# Patient Record
Sex: Female | Born: 1975 | Race: Black or African American | Hispanic: No | Marital: Single | Smoking: Never smoker
Health system: Southern US, Community
[De-identification: ages and names within clinical notes are randomized; demographics above are authoritative.]

---

## 1998-12-29 ENCOUNTER — Emergency Department (HOSPITAL_COMMUNITY): Admission: EM | Admit: 1998-12-29 | Discharge: 1998-12-29 | Payer: Self-pay | Admitting: Emergency Medicine

## 2000-09-28 ENCOUNTER — Encounter: Admission: RE | Admit: 2000-09-28 | Discharge: 2000-09-28 | Payer: Self-pay | Admitting: Obstetrics and Gynecology

## 2000-09-28 ENCOUNTER — Encounter: Payer: Self-pay | Admitting: Obstetrics and Gynecology

## 2001-11-11 ENCOUNTER — Other Ambulatory Visit: Admission: RE | Admit: 2001-11-11 | Discharge: 2001-11-11 | Payer: Self-pay | Admitting: Obstetrics and Gynecology

## 2001-11-19 ENCOUNTER — Encounter: Admission: RE | Admit: 2001-11-19 | Discharge: 2001-11-19 | Payer: Self-pay | Admitting: Obstetrics and Gynecology

## 2001-11-19 ENCOUNTER — Encounter: Payer: Self-pay | Admitting: Obstetrics and Gynecology

## 2003-01-15 ENCOUNTER — Other Ambulatory Visit: Admission: RE | Admit: 2003-01-15 | Discharge: 2003-01-15 | Payer: Self-pay | Admitting: Obstetrics and Gynecology

## 2004-01-19 ENCOUNTER — Other Ambulatory Visit: Admission: RE | Admit: 2004-01-19 | Discharge: 2004-01-19 | Payer: Self-pay | Admitting: Obstetrics and Gynecology

## 2005-05-03 ENCOUNTER — Other Ambulatory Visit: Admission: RE | Admit: 2005-05-03 | Discharge: 2005-05-03 | Payer: Self-pay | Admitting: Obstetrics and Gynecology

## 2015-11-11 ENCOUNTER — Emergency Department (HOSPITAL_COMMUNITY): Payer: Self-pay

## 2015-11-11 ENCOUNTER — Encounter (HOSPITAL_COMMUNITY): Payer: Self-pay | Admitting: *Deleted

## 2015-11-11 ENCOUNTER — Observation Stay (HOSPITAL_COMMUNITY): Payer: Self-pay

## 2015-11-11 ENCOUNTER — Inpatient Hospital Stay (HOSPITAL_COMMUNITY)
Admission: EM | Admit: 2015-11-11 | Discharge: 2015-11-13 | DRG: 446 | Disposition: A | Discharge status: Home / Self Care | Payer: Self-pay | Attending: Internal Medicine | Admitting: Internal Medicine

## 2015-11-11 DIAGNOSIS — R1011 Right upper quadrant pain: Secondary | ICD-10-CM

## 2015-11-11 DIAGNOSIS — K838 Other specified diseases of biliary tract: Secondary | ICD-10-CM | POA: Diagnosis present

## 2015-11-11 DIAGNOSIS — K8070 Calculus of gallbladder and bile duct without cholecystitis without obstruction: Principal | ICD-10-CM | POA: Diagnosis present

## 2015-11-11 DIAGNOSIS — K8050 Calculus of bile duct without cholangitis or cholecystitis without obstruction: Secondary | ICD-10-CM | POA: Diagnosis present

## 2015-11-11 LAB — URINALYSIS, ROUTINE W REFLEX MICROSCOPIC
Glucose, UA: NEGATIVE mg/dL
Ketones, ur: 15 mg/dL — AB
Leukocytes, UA: NEGATIVE
Nitrite: NEGATIVE
Protein, ur: NEGATIVE mg/dL
Specific Gravity, Urine: 1.007 (ref 1.005–1.030)
pH: 6.5 (ref 5.0–8.0)

## 2015-11-11 LAB — CBC WITH DIFFERENTIAL/PLATELET
Basophils Absolute: 0 10*3/uL (ref 0.0–0.1)
Basophils Relative: 1 %
Eosinophils Absolute: 0.1 10*3/uL (ref 0.0–0.7)
Eosinophils Relative: 2 %
HCT: 38.4 % (ref 36.0–46.0)
Hemoglobin: 12.9 g/dL (ref 12.0–15.0)
Lymphocytes Relative: 23 %
Lymphs Abs: 1 10*3/uL (ref 0.7–4.0)
MCH: 28.7 pg (ref 26.0–34.0)
MCHC: 33.6 g/dL (ref 30.0–36.0)
MCV: 85.3 fL (ref 78.0–100.0)
Monocytes Absolute: 0.5 10*3/uL (ref 0.1–1.0)
Monocytes Relative: 11 %
Neutro Abs: 2.8 10*3/uL (ref 1.7–7.7)
Neutrophils Relative %: 63 %
Platelets: 240 10*3/uL (ref 150–400)
RBC: 4.5 MIL/uL (ref 3.87–5.11)
RDW: 13 % (ref 11.5–15.5)
WBC: 4.4 10*3/uL (ref 4.0–10.5)

## 2015-11-11 LAB — URINE MICROSCOPIC-ADD ON

## 2015-11-11 LAB — COMPREHENSIVE METABOLIC PANEL
ALT: 249 U/L — ABNORMAL HIGH (ref 14–54)
AST: 50 U/L — ABNORMAL HIGH (ref 15–41)
Albumin: 4.2 g/dL (ref 3.5–5.0)
Alkaline Phosphatase: 117 U/L (ref 38–126)
Anion gap: 10 (ref 5–15)
BUN: 6 mg/dL (ref 6–20)
CO2: 23 mmol/L (ref 22–32)
Calcium: 9.2 mg/dL (ref 8.9–10.3)
Chloride: 106 mmol/L (ref 101–111)
Creatinine, Ser: 0.63 mg/dL (ref 0.44–1.00)
GFR calc Af Amer: >60 mL/min (ref >60)
GFR calc non Af Amer: >60 mL/min (ref >60)
Glucose, Bld: 119 mg/dL — ABNORMAL HIGH (ref 65–99)
Potassium: 3.8 mmol/L (ref 3.5–5.1)
Sodium: 139 mmol/L (ref 135–145)
Total Bilirubin: 5.1 mg/dL — ABNORMAL HIGH (ref 0.3–1.2)
Total Protein: 7.7 g/dL (ref 6.5–8.1)

## 2015-11-11 LAB — LIPASE, BLOOD: Lipase: 27 U/L (ref 11–51)

## 2015-11-11 LAB — I-STAT BETA HCG BLOOD, ED (MC, WL, AP ONLY)

## 2015-11-11 MED ORDER — HEPARIN SODIUM (PORCINE) 5000 UNIT/ML IJ SOLN
5000.0000 [IU] | Freq: Three times a day (TID) | INTRAMUSCULAR | Status: DC
  Administered 2015-11-11 – 2015-11-13 (×4): 5000 [IU] via SUBCUTANEOUS
  Filled 2015-11-11 (×9): qty 1

## 2015-11-11 MED ORDER — SODIUM CHLORIDE 0.9 % IV SOLN
INTRAVENOUS | Status: DC
  Administered 2015-11-11: 75 mL/h via INTRAVENOUS
  Administered 2015-11-12 (×2): via INTRAVENOUS

## 2015-11-11 MED ORDER — ONDANSETRON HCL 4 MG PO TABS: 4.0000 mg | ORAL_TABLET | Freq: Four times a day (QID) | ORAL | Status: DC | PRN

## 2015-11-11 MED ORDER — GADOBENATE DIMEGLUMINE 529 MG/ML IV SOLN
15.0000 mL | Freq: Once | INTRAVENOUS | Status: AC | PRN
  Administered 2015-11-11: 14 mL via INTRAVENOUS

## 2015-11-11 MED ORDER — MORPHINE SULFATE (PF) 4 MG/ML IV SOLN
4.0000 mg | INTRAVENOUS | Status: DC | PRN
  Administered 2015-11-13 (×3): 4 mg via INTRAVENOUS
  Filled 2015-11-11 (×3): qty 1

## 2015-11-11 MED ORDER — POLYETHYLENE GLYCOL 3350 17 G PO PACK: 17.0000 g | PACK | Freq: Every day | ORAL | Status: DC | PRN

## 2015-11-11 MED ORDER — ONDANSETRON HCL 4 MG/2ML IJ SOLN: 4.0000 mg | Freq: Four times a day (QID) | INTRAMUSCULAR | Status: DC | PRN

## 2015-11-11 NOTE — ED Notes (Signed)
Ultrasound at bedside

## 2015-11-11 NOTE — H&P (Signed)
[facility] History and Physical  Sonia Snyder ZOX:[id number] DOB: 1976-06-24 DOA: 11/11/2015  Referring physician: Dr. Clydene Pugh PCP: No primary care provider on file.   Chief Complaint: abd pain  HPI: Sonia Snyder is a 40 y.o. female with no significant past medical history comes into the hospital for abdominal pain that started 4 days prior to admission she relates it would be intermittent, and improved with drinking water. Nothing made it worse. The day prior to admission she started having severe abdominal pain with radiation to her back progressively getting worst anorexic. She took 2 ibuprofen and came to the ED. She denies any fever, chills, nausea, vomiting or diarrhea  In the ED: Is found to have mild elevation in AST and ALT.  Review of Systems:  Constitutional:  No weight loss, night sweats, Fevers, chills, fatigue.  HEENT:  No headaches, Difficulty swallowing,Tooth/dental problems,Sore throat,  No sneezing, itching, ear ache, nasal congestion, post nasal drip,  Cardio-vascular:  No chest pain, Orthopnea, PND, swelling in lower extremities, anasarca, dizziness, palpitations  GI:  No heartburn, indigestion,  nausea, vomiting, diarrhea, change in bowel habits, loss of appetite  Resp:  No shortness of breath with exertion or at rest. No excess mucus, no productive cough, No non-productive cough, No coughing up of blood.No change in color of mucus.No wheezing.No chest wall deformity  Skin:  no rash or lesions.  GU:  no dysuria, change in color of urine, no urgency or frequency. No flank pain.  Musculoskeletal:  No joint pain or swelling. No decreased range of motion. No back pain.  Psych:  No change in mood or affect. No depression or anxiety. No memory loss.   History reviewed. No pertinent past medical history. History reviewed. No pertinent past surgical history. Social History:  reports that she has never smoked. She does not have any smokeless tobacco history  on file. She reports that she does not drink alcohol. Her drug history is not on file.  No Known Allergies  Family History  Problem Relation Age of Onset  . Diabetes Mellitus II Mother   . Hypertension Mother     Prior to Admission medications   Medication Sig Start Date End Date Taking? Authorizing Provider  ibuprofen (ADVIL,MOTRIN) 200 MG tablet Take 400 mg by mouth every 6 (six) hours as needed for headache, mild pain or moderate pain.   Yes Historical Provider, MD   Physical Exam: Filed Vitals:   11/11/15 0530 11/11/15 8119 11/11/15 0756 11/11/15 0958  BP:  111/67 118/71 114/69  Pulse:  73 67 69  Temp:      TempSrc:      Resp:  Height:  (1.702 m)     Weight: 70.308 kg (155 lb)     SpO2:  100% 98% 100%    Wt Readings from Last 3 Encounters:  11/11/15 70.308 kg (155 lb)    General:  Appears calm and comfortable Eyes: PERRL, normal lids, irises & conjunctiva ENT: grossly normal hearing, lips & tongue Neck: no LAD, masses or thyromegaly Cardiovascular: RRR, no m/r/g. No LE edema. Telemetry: SR, no arrhythmias  Respiratory: CTA bilaterally, no w/r/r. Normal respiratory effort. Abdomen: soft, right upper quadrant tenderness Murphy sign negative nondistended with bowel sounds Skin: no rash or induration seen on limited exam Musculoskeletal: grossly normal tone BUE/BLE Psychiatric: grossly normal mood and affect, speech fluent and appropriate Neurologic: grossly non-focal.          Labs on Admission:  Basic Metabolic Panel:  Recent Labs Lab 11/11/15 0642  NA 139  K 3.8  CL 106  CO2 23  GLUCOSE 119*  BUN 6  CREATININE 0.63  CALCIUM 9.2   Liver Function Tests:  Recent Labs Lab 11/11/15 0642  AST 50*  ALT 249*  ALKPHOS 117  BILITOT 5.1*  PROT 7.7  ALBUMIN 4.2    Recent Labs Lab 11/11/15 0642  LIPASE 27   No results for input(s): AMMONIA in the last 168 hours. CBC:  Recent Labs Lab 11/11/15 0642  WBC 4.4  NEUTROABS 2.8  HGB  12.9  HCT 38.4  MCV 85.3  PLT 240   Cardiac Enzymes: No results for input(s): CKTOTAL, CKMB, CKMBINDEX, TROPONINI in the last 168 hours.  BNP (last 3 results) No results for input(s): BNP in the last 8760 hours.  ProBNP (last 3 results) No results for input(s): PROBNP in the last 8760 hours.  CBG: No results for input(s): GLUCAP in the last 168 hours.  Radiological Exams on Admission: [place] Abdomen Limited Ruq  11/11/2015  CLINICAL DATA:  Right upper quadrant pain back pain duration 3 days EXAM: US ABDOMEN LIMITED - RIGHT UPPER QUADRANT COMPARISON:  None. FINDINGS: Gallbladder: There are multiple calculi within the gallbladder. The largest calculus measures 9 mm. Borderline gallbladder wall thickening. No pericholecystic fluid. Common bile duct: Diameter: 8 mm. Liver: There is mild intrahepatic bile duct dilatation. No focal liver lesions. IMPRESSION: Cholelithiasis. Mild bile duct dilatation, both intrahepatic and extrahepatic. This is concerning for possible choledocholithiasis. Electronically Signed   By: Ellery Plunkaniel R Mitchell M.D.   On: 11/11/2015 06:45    EKG: Independently reviewed.  Assessment/Plan Active Problems:   Choledocholithiasis Abdominal ultrasound done in the emergency room showed dilated intrahepatic and extrahepatic ducts, her alkaline phosphatase is 117 she still has mild elevation of her ALT and AST higher than the latter, with mild elevation of her bilirubin She could've passed a stone, but either way would get an MRCP and will have GI evaluate her.  We'll place her nothing by mouth until she is evaluated by GI and MRCP done. Repeat LFTs in the morning. GI has been consulted by the emergency room physician  Code Status: full DVT Prophylaxis:heparin Family Communication: none  Disposition Plan: observation  Time spent:60 min  Marinda ElkFELIZ ORTIZ, ABRAHAM [facility] Pager [phone]

## 2015-11-11 NOTE — Consult Note (Signed)
[facility] Surgery Consult Note  Sonia Snyder [birth year]  [id number].    Requesting MD: Dr. Laneta Simmers Chief Complaint/Reason for Consult: Cholelithiasis  HPI:  40 y/o AA female with no significant PMH and no surgical history except for WTE comes to [facility] for abdominal pain that started 4 days prior to admission (Monday 11/08/15).  Pain is intermittent, and improved with drinking water with apple cider vinegar in it. No precipitating factors.  She says on Monday she had a big breakfast then 5-7 hours later the pain began.  She has had a normal appetite and normal bowel functions.  Yesterday she started having severe abdominal pain with radiation to her back and progressive anorexia.  She took 2 ibuprofen and came to the ED. She thought she was having a kidney stone.  She denies any fever, chills, nausea, vomiting, diarrhea, CP/SOB, or dysuria.  No abdominal surgery or h/o abdominal problems.    ROS: All systems reviewed and otherwise negative except for as above  Family History  Problem Relation Age of Onset  . Diabetes Mellitus II Mother   . Hypertension Mother     History reviewed. No pertinent past medical history.  History reviewed. No pertinent past surgical history.  Social History:  reports that she has never smoked. She does not have any smokeless tobacco history on file. She reports that she does not drink alcohol. Her drug history is not on file.  Allergies: No Known Allergies  Medications Prior to Admission  Medication Sig Dispense Refill  . ibuprofen (ADVIL,MOTRIN) 200 MG tablet Take 400 mg by mouth every 6 (six) hours as needed for headache, mild pain or moderate pain.      Blood pressure 123/59, pulse 73, temperature 97.8 F (36.6 C), temperature source Oral, resp. rate 20, height 5' 7"  (1.702 m), weight 155 lb (70.308 kg), last menstrual period 11/11/2015, SpO2 100 %. Physical Exam: General: pleasant, WD/WN AA female who is laying in bed in NAD HEENT: head is  normocephalic, atraumatic.  Sclera are noninjected.  PERRL.  Ears and nose without any masses or lesions.  Mouth is pink and moist Heart: regular, rate, and rhythm.  No obvious murmurs, gallops, or rubs noted.  Palpable pedal pulses bilaterally Lungs: CTAB, no wheezes, rhonchi, or rales noted.  Respiratory effort nonlabored Abd: soft, mild tenderness in the RUQ, ND, +BS, no masses, hernias, or organomegaly, no surgical scars MS: all 4 extremities are symmetrical with no cyanosis, clubbing, or edema. Skin: warm and dry with no masses, lesions, or rashes Psych: A&Ox3 with an appropriate affect.   Results for orders placed or performed during the hospital encounter of 11/11/15 (from the past 48 hour(s))  Urinalysis, Routine w reflex microscopic-may I&O cath if menses (not at [facility])     Status: Abnormal   Collection Time: 11/11/15  5:30 AM  Result Value Ref Range   Color, Urine AMBER (A) YELLOW    Comment: BIOCHEMICALS MAY BE AFFECTED BY COLOR   APPearance CLOUDY (A) CLEAR   Specific Gravity, Urine 1.007 1.005 - 1.030   pH 6.5 5.0 - 8.0   Glucose, UA NEGATIVE NEGATIVE mg/dL   Hgb urine dipstick LARGE (A) NEGATIVE   Bilirubin Urine MODERATE (A) NEGATIVE   Ketones, ur 15 (A) NEGATIVE mg/dL   Protein, ur NEGATIVE NEGATIVE mg/dL   Nitrite NEGATIVE NEGATIVE   Leukocytes, UA NEGATIVE NEGATIVE  Urine microscopic-add on     Status: Abnormal   Collection Time: 11/11/15  5:30 AM  Result Value Ref Range  Squamous Epithelial / LPF 0-5 (A) NONE SEEN   WBC, UA 0-5 0 - 5 WBC/hpf   RBC / HPF TOO NUMEROUS TO COUNT 0 - 5 RBC/hpf   Bacteria, UA RARE (A) NONE SEEN  CBC with Differential/Platelet     Status: None   Collection Time: 11/11/15  6:42 AM  Result Value Ref Range   WBC 4.4 4.0 - 10.5 K/uL   RBC 4.50 3.87 - 5.11 MIL/uL   Hemoglobin 12.9 12.0 - 15.0 g/dL   HCT 38.4 36.0 - 46.0 %   MCV 85.3 78.0 - 100.0 fL   MCH 28.7 26.0 - 34.0 pg   MCHC 33.6 30.0 - 36.0 g/dL   RDW 13.0 11.5 - 15.5 %    Platelets 240 150 - 400 K/uL   Neutrophils Relative % 63 %   Neutro Abs 2.8 1.7 - 7.7 K/uL   Lymphocytes Relative 23 %   Lymphs Abs 1.0 0.7 - 4.0 K/uL   Monocytes Relative 11 %   Monocytes Absolute 0.5 0.1 - 1.0 K/uL   Eosinophils Relative 2 %   Eosinophils Absolute 0.1 0.0 - 0.7 K/uL   Basophils Relative 1 %   Basophils Absolute 0.0 0.0 - 0.1 K/uL  Comprehensive metabolic panel     Status: Abnormal   Collection Time: 11/11/15  6:42 AM  Result Value Ref Range   Sodium 139 135 - 145 mmol/L   Potassium 3.8 3.5 - 5.1 mmol/L   Chloride 106 101 - 111 mmol/L   CO2 23 22 - 32 mmol/L   Glucose, Bld 119 (H) 65 - 99 mg/dL   BUN 6 6 - 20 mg/dL   Creatinine, Ser 0.63 0.44 - 1.00 mg/dL   Calcium 9.2 8.9 - 10.3 mg/dL   Total Protein 7.7 6.5 - 8.1 g/dL   Albumin 4.2 3.5 - 5.0 g/dL   AST 50 (H) 15 - 41 U/L   ALT 249 (H) 14 - 54 U/L   Alkaline Phosphatase 117 38 - 126 U/L   Total Bilirubin 5.1 (H) 0.3 - 1.2 mg/dL   GFR calc non Af Amer >60 >60 mL/min   GFR calc Af Amer >60 >60 mL/min    Comment: (NOTE) The eGFR has been calculated using the CKD EPI equation. This calculation has not been validated in all clinical situations. eGFR's persistently <60 mL/min signify possible Chronic Kidney Disease.    Anion gap 10 5 - 15  Lipase, blood     Status: None   Collection Time: 11/11/15  6:42 AM  Result Value Ref Range   Lipase 27 11 - 51 U/L  I-Stat beta hCG blood, ED ([facility], [facility], [facility] only)     Status: None   Collection Time: 11/11/15  6:48 AM  Result Value Ref Range   I-stat hCG, quantitative <5.0 <5 mIU/mL   Comment 3            Comment:   GEST. AGE      CONC.  (mIU/mL)   <=1 WEEK        5 - 50     2 WEEKS       50 - 500     3 WEEKS       100 - 10,000     4 WEEKS     1,000 - 30,000        FEMALE AND NON-PREGNANT FEMALE:     LESS THAN 5 mIU/mL    US Abdomen Limited Ruq  11/11/2015  CLINICAL DATA:  Right upper quadrant pain back pain duration 3 days EXAM: US ABDOMEN LIMITED - RIGHT UPPER  QUADRANT COMPARISON:  None. FINDINGS: Gallbladder: There are multiple calculi within the gallbladder. The largest calculus measures 9 mm. Borderline gallbladder wall thickening. No pericholecystic fluid. Common bile duct: Diameter: 8 mm. Liver: There is mild intrahepatic bile duct dilatation. No focal liver lesions. IMPRESSION: Cholelithiasis. Mild bile duct dilatation, both intrahepatic and extrahepatic. This is concerning for possible choledocholithiasis. Electronically Signed   By: Andreas Newport M.D.   On: 11/11/2015 06:45      Assessment/Plan Cholelithiasis with possible choledocholithiasis RUQ abdominal pain Hyperbilirubinemia Transaminitis  Plan: 1.  MRCP pending, if positive for stones, GI will proceed with ERCP.  If negative we would recommend lap chole, but the patient is adamant about NO SURGERY.  I have discussed her pathophysiology and the surgery in detail and reviewed anatomy pictures with her, but she is scared and does not want surgery.  I have discussed the consequences of leaving the gallbladder intact and the likelihood of this happening again.  She understands and does not want surgery. 2.  If we can be of further help, please feel free to call us back 3.  Would recommend low fat diet indefinately   Nat Christen, [facility] Surgery 11/11/2015, 1:48 PM Pager: [phone] (7am - 4:30pm M-F; 7am - 11:30am Sa/Su)  Agree with above. MRCP pending - though I think that she will have a CBD stone.  I discussed the role and purpose of gall bladder surgery. I discussed the risks of gall bladder surgery.  The primary risks of gall bladder surgery include, but are not limited to, bleeding, infection, common bile duct injury, and open surgery.  There is also the risk that the patient may have continued symptoms after surgery.  We discussed the typical post-operative recovery course. I tried to answer the patient's questions. If she elects not to have surgery - she is  aware she can get further common bile duct stones.  She could develop significant gall bladder infection, which may preclude laparoscopic surgery. But the final decision is hers.  At this time, she is undecided about surgery  Alphonsa Overall, MD, [facility] Surgery Pager: [phone] Office phone:  [phone]

## 2015-11-11 NOTE — Consult Note (Addendum)
Reason for Consult: Questionable CBD stones Referring Physician: Hospital team  Sonia Snyder is an 40 y.o. female.  HPI: Patient seen and examined and her hospital computer chart was reviewed and I discussed her case with the ER physician and she is currently feeling much better and has an essentially negative GI history and only one cousin with gallbladder problems and no previous surgeries who began having abdominal pain and nausea vomiting and presented to the emergency room but her pain has since resolved and her ultrasound and labs were reviewed and she did just start a new job and we discussed how she cannot miss much work as well as the risks of gallbladder problems  History reviewed. No pertinent past medical history.  History reviewed. No pertinent past surgical history.  Family History  Problem Relation Age of Onset  . Diabetes Mellitus II Mother   . Hypertension Mother     Social History:  reports that she has never smoked. She does not have any smokeless tobacco history on file. She reports that she does not drink alcohol. Her drug history is not on file.  Allergies: No Known Allergies  Medications: I have reviewed the patient's current medications.  Results for orders placed or performed during the hospital encounter of 11/11/15 (from the past 48 hour(s))  Urinalysis, Routine w reflex microscopic-may I&O cath if menses (not at [facility])     Status: Abnormal   Collection Time: 11/11/15  5:30 AM  Result Value Ref Range   Color, Urine AMBER (A) YELLOW    Comment: BIOCHEMICALS MAY BE AFFECTED BY COLOR   APPearance CLOUDY (A) CLEAR   Specific Gravity, Urine 1.007 1.005 - 1.030   pH 6.5 5.0 - 8.0   Glucose, UA NEGATIVE NEGATIVE mg/dL   Hgb urine dipstick LARGE (A) NEGATIVE   Bilirubin Urine MODERATE (A) NEGATIVE   Ketones, ur 15 (A) NEGATIVE mg/dL   Protein, ur NEGATIVE NEGATIVE mg/dL   Nitrite NEGATIVE NEGATIVE   Leukocytes, UA NEGATIVE NEGATIVE  Urine microscopic-add on      Status: Abnormal   Collection Time: 11/11/15  5:30 AM  Result Value Ref Range   Squamous Epithelial / LPF 0-5 (A) NONE SEEN   WBC, UA 0-5 0 - 5 WBC/hpf   RBC / HPF TOO NUMEROUS TO COUNT 0 - 5 RBC/hpf   Bacteria, UA RARE (A) NONE SEEN  CBC with Differential/Platelet     Status: None   Collection Time: 11/11/15  6:42 AM  Result Value Ref Range   WBC 4.4 4.0 - 10.5 K/uL   RBC 4.50 3.87 - 5.11 MIL/uL   Hemoglobin 12.9 12.0 - 15.0 g/dL   HCT 38.4 36.0 - 46.0 %   MCV 85.3 78.0 - 100.0 fL   MCH 28.7 26.0 - 34.0 pg   MCHC 33.6 30.0 - 36.0 g/dL   RDW 13.0 11.5 - 15.5 %   Platelets 240 150 - 400 K/uL   Neutrophils Relative % 63 %   Neutro Abs 2.8 1.7 - 7.7 K/uL   Lymphocytes Relative 23 %   Lymphs Abs 1.0 0.7 - 4.0 K/uL   Monocytes Relative 11 %   Monocytes Absolute 0.5 0.1 - 1.0 K/uL   Eosinophils Relative 2 %   Eosinophils Absolute 0.1 0.0 - 0.7 K/uL   Basophils Relative 1 %   Basophils Absolute 0.0 0.0 - 0.1 K/uL  Comprehensive metabolic panel     Status: Abnormal   Collection Time: 11/11/15  6:42 AM  Result Value Ref Range  Sodium 139 135 - 145 mmol/L   Potassium 3.8 3.5 - 5.1 mmol/L   Chloride 106 101 - 111 mmol/L   CO2 23 22 - 32 mmol/L   Glucose, Bld 119 (H) 65 - 99 mg/dL   BUN 6 6 - 20 mg/dL   Creatinine, Ser 0.63 0.44 - 1.00 mg/dL   Calcium 9.2 8.9 - 10.3 mg/dL   Total Protein 7.7 6.5 - 8.1 g/dL   Albumin 4.2 3.5 - 5.0 g/dL   AST 50 (H) 15 - 41 U/L   ALT 249 (H) 14 - 54 U/L   Alkaline Phosphatase 117 38 - 126 U/L   Total Bilirubin 5.1 (H) 0.3 - 1.2 mg/dL   GFR calc non Af Amer >60 >60 mL/min   GFR calc Af Amer >60 >60 mL/min    Comment: (NOTE) The eGFR has been calculated using the CKD EPI equation. This calculation has not been validated in all clinical situations. eGFR's persistently <60 mL/min signify possible Chronic Kidney Disease.    Anion gap 10 5 - 15  Lipase, blood     Status: None   Collection Time: 11/11/15  6:42 AM  Result Value Ref Range    Lipase 27 11 - 51 U/L  I-Stat beta hCG blood, ED ([facility], [facility], [facility] only)     Status: None   Collection Time: 11/11/15  6:48 AM  Result Value Ref Range   I-stat hCG, quantitative <5.0 <5 mIU/mL   Comment 3            Comment:   GEST. AGE      CONC.  (mIU/mL)   <=1 WEEK        5 - 50     2 WEEKS       50 - 500     3 WEEKS       100 - 10,000     4 WEEKS     1,000 - 30,000        FEMALE AND NON-PREGNANT FEMALE:     LESS THAN 5 mIU/mL     US Abdomen Limited Ruq  11/11/2015  CLINICAL DATA:  Right upper quadrant pain back pain duration 3 days EXAM: US ABDOMEN LIMITED - RIGHT UPPER QUADRANT COMPARISON:  None. FINDINGS: Gallbladder: There are multiple calculi within the gallbladder. The largest calculus measures 9 mm. Borderline gallbladder wall thickening. No pericholecystic fluid. Common bile duct: Diameter: 8 mm. Liver: There is mild intrahepatic bile duct dilatation. No focal liver lesions. IMPRESSION: Cholelithiasis. Mild bile duct dilatation, both intrahepatic and extrahepatic. This is concerning for possible choledocholithiasis. Electronically Signed   By: Andreas Newport M.D.   On: 11/11/2015 06:45    ROS negative except above Blood pressure 123/59, pulse 73, temperature 97.8 F (36.6 C), temperature source Oral, resp. rate 20, height _0  (1.702 m), weight 70.308 kg (155 lb), last menstrual period 11/11/2015, SpO2 100 %. Physical Exam Vital signs stable afebrile no acute distress lungs clear regular rate and rhythm abdomen is soft nontender labs and ultrasound reviewed Assessment/Plan: Question early passed CBD stone Plan: Await MRCP if positive will proceed with ERCP tomorrow and the risks benefits methods options and success rate was discussed thoroughly with the patient however if MRCP is negative you could advance diet and discharged soon with outpatient surgical follow-up to consider laparoscopic cholecystectomy with Intra-Op cholangiogram and the warnings of putting that off was  discussed with the patient as well And please call me ASAP with results of MRCP so as  to set up ERCP if needed  [facility] E 11/11/2015, 2:38 PM

## 2015-11-11 NOTE — ED Notes (Signed)
US at bedside

## 2015-11-11 NOTE — ED Notes (Signed)
Hospitalist at bedside 

## 2015-11-11 NOTE — ED Notes (Signed)
Patient is alert and oriented x4.  She is complaining of right flank pain that starred on Monday. Patient denies ever having this issue before.  Currently she rates her pain 8 of 10 without nausea.

## 2015-11-11 NOTE — ED Provider Notes (Signed)
Care assumed from Dr. Blinda LeatherwoodPollina at 0800 with plan for GI consultation for suspected choledocholithiasis.   Results:  BP 118/71 mmHg  Pulse 67  Temp(Src) 98 F (36.7 C) (Oral)  Resp 18  Ht 5\' 7"  (1.702 m)  Wt 155 lb (70.308 kg)  BMI 24.27 kg/m2  SpO2 98%  LMP 11/11/2015 (Exact Date)  Results for orders placed or performed during the hospital encounter of 11/11/15  Urinalysis, Routine w reflex microscopic-may I&O cath if menses ([facility])  Result Value Ref Range   Color, Urine AMBER (A) YELLOW   APPearance CLOUDY (A) CLEAR   Specific Gravity, Urine 1.007 1.005 - 1.030   pH 6.5 5.0 - 8.0   Glucose, UA NEGATIVE NEGATIVE mg/dL   Hgb urine dipstick LARGE (A) NEGATIVE   Bilirubin Urine MODERATE (A) NEGATIVE   Ketones, ur 15 (A) NEGATIVE mg/dL   Protein, ur NEGATIVE NEGATIVE mg/dL   Nitrite NEGATIVE NEGATIVE   Leukocytes, UA NEGATIVE NEGATIVE  CBC with Differential/Platelet  Result Value Ref Range   WBC 4.4 4.0 - 10.5 K/uL   RBC 4.50 3.87 - 5.11 MIL/uL   Hemoglobin 12.9 12.0 - 15.0 g/dL   HCT 82.938.4 56.236.0 - 13.046.0 %   MCV 85.3 78.0 - 100.0 fL   MCH 28.7 26.0 - 34.0 pg   MCHC 33.6 30.0 - 36.0 g/dL   RDW 86.513.0 78.411.5 - 69.615.5 %   Platelets 240 150 - 400 K/uL   Neutrophils Relative % 63 %   Neutro Abs 2.8 1.7 - 7.7 K/uL   Lymphocytes Relative 23 %   Lymphs Abs 1.0 0.7 - 4.0 K/uL   Monocytes Relative 11 %   Monocytes Absolute 0.5 0.1 - 1.0 K/uL   Eosinophils Relative 2 %   Eosinophils Absolute 0.1 0.0 - 0.7 K/uL   Basophils Relative 1 %   Basophils Absolute 0.0 0.0 - 0.1 K/uL  Comprehensive metabolic panel  Result Value Ref Range   Sodium 139 135 - 145 mmol/L   Potassium 3.8 3.5 - 5.1 mmol/L   Chloride 106 101 - 111 mmol/L   CO2 23 22 - 32 mmol/L   Glucose, Bld 119 (H) 65 - 99 mg/dL   BUN 6 6 - 20 mg/dL   Creatinine, Ser 2.950.63 0.44 - 1.00 mg/dL   Calcium 9.2 8.9 - 28.410.3 mg/dL   Total Protein 7.7 6.5 - 8.1 g/dL   Albumin 4.2 3.5 - 5.0 g/dL   AST 50 (H) 15 - 41 U/L   ALT 249 (H)  14 - 54 U/L   Alkaline Phosphatase 117 38 - 126 U/L   Total Bilirubin 5.1 (H) 0.3 - 1.2 mg/dL   GFR calc non Af Amer >60 >60 mL/min   GFR calc Af Amer >60 >60 mL/min   Anion gap 10 5 - 15  Lipase, blood  Result Value Ref Range   Lipase 27 11 - 51 U/L  Urine microscopic-add on  Result Value Ref Range   Squamous Epithelial / LPF 0-5 (A) NONE SEEN   WBC, UA 0-5 0 - 5 WBC/hpf   RBC / HPF TOO NUMEROUS TO COUNT 0 - 5 RBC/hpf   Bacteria, UA RARE (A) NONE SEEN  I-Stat beta hCG blood, ED ([facility], [facility], [facility] only)  Result Value Ref Range   I-stat hCG, quantitative <5.0 <5 mIU/mL   Comment 3            [place] Abdomen Limited Ruq  11/11/2015  CLINICAL DATA:  Right upper quadrant pain back  pain duration 3 days EXAM: US ABDOMEN LIMITED - RIGHT UPPER QUADRANT COMPARISON:  None. FINDINGS: Gallbladder: There are multiple calculi within the gallbladder. The largest calculus measures 9 mm. Borderline gallbladder wall thickening. No pericholecystic fluid. Common bile duct: Diameter: 8 mm. Liver: There is mild intrahepatic bile duct dilatation. No focal liver lesions. IMPRESSION: Cholelithiasis. Mild bile duct dilatation, both intrahepatic and extrahepatic. This is concerning for possible choledocholithiasis. Electronically Signed   By: Ellery Plunk M.D.   On: 11/11/2015 06:45    Radiology and laboratory examinations were reviewed by me and used in medical decision making if performed.   MDM:  GI recommending MRCP and surgical consultation if indicated by results vs endoscopic manipulation. Hospitalist was consulted for admission and will see the patient in the emergency department. I was asked to discuss case with surgery team, they recommend medical admission, complete MRCP and they would consider cholecystectomy after resolution.   Diagnoses that have been ruled out:  None  Diagnoses that are still under consideration:  None  Final diagnoses:  Right upper quadrant pain  Choledocholithiasis     Lyndal Pulley, MD 11/11/15 1001

## 2015-11-12 ENCOUNTER — Encounter (HOSPITAL_COMMUNITY): Payer: Self-pay | Admitting: Certified Registered"

## 2015-11-12 ENCOUNTER — Encounter (HOSPITAL_COMMUNITY)
Admission: EM | Disposition: A | Discharge status: Home / Self Care | Payer: Self-pay | Source: Home / Self Care | Attending: Internal Medicine

## 2015-11-12 ENCOUNTER — Observation Stay (HOSPITAL_COMMUNITY): Payer: MEDICAID | Admitting: Anesthesiology

## 2015-11-12 ENCOUNTER — Observation Stay (HOSPITAL_COMMUNITY): Payer: MEDICAID

## 2015-11-12 DIAGNOSIS — K8050 Calculus of bile duct without cholangitis or cholecystitis without obstruction: Secondary | ICD-10-CM

## 2015-11-12 HISTORY — PX: ERCP: SHX5425

## 2015-11-12 LAB — COMPREHENSIVE METABOLIC PANEL
ALBUMIN: 3.6 g/dL (ref 3.5–5.0)
ALT: 183 U/L — ABNORMAL HIGH (ref 14–54)
ANION GAP: 13 (ref 5–15)
AST: 42 U/L — AB (ref 15–41)
Alkaline Phosphatase: 109 U/L (ref 38–126)
BILIRUBIN TOTAL: 5 mg/dL — AB (ref 0.3–1.2)
BUN: 8 mg/dL (ref 6–20)
CHLORIDE: 107 mmol/L (ref 101–111)
CO2: 21 mmol/L — ABNORMAL LOW (ref 22–32)
Calcium: 8.9 mg/dL (ref 8.9–10.3)
Creatinine, Ser: 0.63 mg/dL (ref 0.44–1.00)
GFR calc Af Amer: >60 mL/min (ref >60)
GFR calc non Af Amer: >60 mL/min (ref >60)
GLUCOSE: 83 mg/dL (ref 65–99)
POTASSIUM: 3.8 mmol/L (ref 3.5–5.1)
SODIUM: 141 mmol/L (ref 135–145)
TOTAL PROTEIN: 7.2 g/dL (ref 6.5–8.1)

## 2015-11-12 LAB — CBC
HEMATOCRIT: 34.4 % — AB (ref 36.0–46.0)
Hemoglobin: 11.4 g/dL — ABNORMAL LOW (ref 12.0–15.0)
MCH: 28.5 pg (ref 26.0–34.0)
MCHC: 33.1 g/dL (ref 30.0–36.0)
MCV: 86 fL (ref 78.0–100.0)
PLATELETS: 244 10*3/uL (ref 150–400)
RBC: 4 MIL/uL (ref 3.87–5.11)
RDW: 13.1 % (ref 11.5–15.5)
WBC: 5.5 10*3/uL (ref 4.0–10.5)

## 2015-11-12 SURGERY — ERCP, WITH INTERVENTION IF INDICATED
Anesthesia: General

## 2015-11-12 MED ORDER — LIDOCAINE HCL (CARDIAC) 20 MG/ML IV SOLN
INTRAVENOUS | Status: AC
  Filled 2015-11-12: qty 5

## 2015-11-12 MED ORDER — LACTATED RINGERS IV SOLN
INTRAVENOUS | Status: DC
  Administered 2015-11-12: 13:00:00 via INTRAVENOUS
  Administered 2015-11-12: 1000 mL via INTRAVENOUS

## 2015-11-12 MED ORDER — MEPERIDINE HCL 100 MG/ML IJ SOLN: 6.2500 mg | INTRAMUSCULAR | Status: DC | PRN

## 2015-11-12 MED ORDER — ONDANSETRON HCL 4 MG/2ML IJ SOLN: 4.0000 mg | Freq: Once | INTRAMUSCULAR | Status: DC

## 2015-11-12 MED ORDER — PROPOFOL 10 MG/ML IV BOLUS
INTRAVENOUS | Status: DC | PRN
  Administered 2015-11-12: 50 mg via INTRAVENOUS
  Administered 2015-11-12: 150 mg via INTRAVENOUS
  Administered 2015-11-12: 20 mg via INTRAVENOUS

## 2015-11-12 MED ORDER — MIDAZOLAM HCL 5 MG/5ML IJ SOLN
INTRAMUSCULAR | Status: DC | PRN
  Administered 2015-11-12: 2 mg via INTRAVENOUS

## 2015-11-12 MED ORDER — DEXAMETHASONE SODIUM PHOSPHATE 10 MG/ML IJ SOLN
INTRAMUSCULAR | Status: DC | PRN
  Administered 2015-11-12: 10 mg via INTRAVENOUS

## 2015-11-12 MED ORDER — GLUCAGON HCL RDNA (DIAGNOSTIC) 1 MG IJ SOLR
INTRAMUSCULAR | Status: AC
  Filled 2015-11-12: qty 1

## 2015-11-12 MED ORDER — LIDOCAINE HCL (CARDIAC) 20 MG/ML IV SOLN
INTRAVENOUS | Status: DC | PRN
  Administered 2015-11-12: 100 mg via INTRAVENOUS

## 2015-11-12 MED ORDER — PROPOFOL 10 MG/ML IV BOLUS
INTRAVENOUS | Status: AC
  Filled 2015-11-12: qty 20

## 2015-11-12 MED ORDER — SUCCINYLCHOLINE CHLORIDE 20 MG/ML IJ SOLN
INTRAMUSCULAR | Status: DC | PRN
  Administered 2015-11-12: 100 mg via INTRAVENOUS

## 2015-11-12 MED ORDER — SODIUM CHLORIDE 0.9 % IV SOLN: INTRAVENOUS | Status: DC

## 2015-11-12 MED ORDER — MIDAZOLAM HCL 2 MG/2ML IJ SOLN
INTRAMUSCULAR | Status: AC
  Filled 2015-11-12: qty 2

## 2015-11-12 MED ORDER — ONDANSETRON HCL 4 MG/2ML IJ SOLN
INTRAMUSCULAR | Status: DC | PRN
  Administered 2015-11-12: 4 mg via INTRAVENOUS

## 2015-11-12 MED ORDER — CIPROFLOXACIN IN D5W 400 MG/200ML IV SOLN
INTRAVENOUS | Status: AC
  Filled 2015-11-12: qty 200

## 2015-11-12 MED ORDER — FENTANYL CITRATE (PF) 100 MCG/2ML IJ SOLN: 25.0000 ug | INTRAMUSCULAR | Status: DC | PRN

## 2015-11-12 MED ORDER — FENTANYL CITRATE (PF) 100 MCG/2ML IJ SOLN
INTRAMUSCULAR | Status: AC
  Filled 2015-11-12: qty 2

## 2015-11-12 MED ORDER — INDOMETHACIN 50 MG RE SUPP
100.0000 mg | Freq: Once | RECTAL | Status: AC
  Administered 2015-11-12: 100 mg via RECTAL

## 2015-11-12 MED ORDER — INDOMETHACIN 50 MG RE SUPP
RECTAL | Status: AC
  Filled 2015-11-12: qty 2

## 2015-11-12 MED ORDER — SODIUM CHLORIDE 0.9 % IV SOLN
INTRAVENOUS | Status: DC | PRN
  Administered 2015-11-12: 50 mL

## 2015-11-12 MED ORDER — PHENYLEPHRINE HCL 10 MG/ML IJ SOLN
INTRAMUSCULAR | Status: DC | PRN
  Administered 2015-11-12: 40 ug via INTRAVENOUS
  Administered 2015-11-12: 80 ug via INTRAVENOUS

## 2015-11-12 MED ORDER — FENTANYL CITRATE (PF) 100 MCG/2ML IJ SOLN
INTRAMUSCULAR | Status: DC | PRN
  Administered 2015-11-12 (×2): 50 ug via INTRAVENOUS

## 2015-11-12 MED ORDER — CIPROFLOXACIN IN D5W 400 MG/200ML IV SOLN
400.0000 mg | Freq: Once | INTRAVENOUS | Status: AC
  Administered 2015-11-12: 400 mg via INTRAVENOUS
  Filled 2015-11-12: qty 200

## 2015-11-12 NOTE — Op Note (Signed)
[facility] Patient Name: Sonia Snyder Procedure Date: 11/12/2015 MRN: [id number] Attending MD: Vida Rigger , MD Date of Birth: [birth year] CSN:  Age: 40 Admit Type: Inpatient Procedure:                ERCP Indications:              Suspected bile duct stone(s) Providers:                Vida Rigger, MD, Dwain Sarna, RN, Kandice Robinsons,                            Technician Referring MD:              Medicines:                General Anesthesia Complications:            No immediate complications. Estimated Blood Loss:     Estimated blood loss: none. Procedure:                Pre-Anesthesia Assessment:                           - Prior to the procedure, a History and Physical                            was performed, and patient medications and                            allergies were reviewed. The patient's tolerance of                            previous anesthesia was also reviewed. The risks                            and benefits of the procedure and the sedation                            options and risks were discussed with the patient.                            All questions were answered, and informed consent                            was obtained. Prior Anticoagulants: The patient has                            taken no previous anticoagulant or antiplatelet                            agents. ASA Grade Assessment: I - A normal, healthy                            patient. After reviewing the risks and benefits,                            the patient  was deemed in satisfactory condition to                            undergo the procedure.                           After obtaining informed consent, the scope was                            passed under direct vision. Throughout the                            procedure, the patient's blood pressure, pulse, and                            oxygen saturations were monitored continuously. The           [id number] ([id number]) scope was introduced through                            the mouth, and used to inject contrast into and                            used to locate the major papilla. The ERCP was                            somewhat difficult due to challenging cannulation.                            The patient tolerated the procedure well. Findings:      The major papilla was bulging. we initially tried to cannulate using the       regular sphincterotome loaded with the 0.035 Jagwirebut it seemed to go       towards the pancreas on multiple timesand no dye was injected but we       went ahead and placed a 5 Fr by 3 cm temporary plastic stent with a       single internal flap and a single external pigtail was placed into the       ventral pancreatic duct. The stent was in good position. we then tried       to cannulate the CBD with this regular sphincterotomebut were       unsuccessful so we switched to this smaller sphincterotome and the       smaller JAG Jagwire and were able toobtain cannulation in the bile duct       was deeply cannulated with the Hydratome sphincterotome. Contrast was       injected. I personally interpreted the bile duct images. Image quality       was adequate. Contrast extended to the main bile duct. Contrast extended       to the bifurcation. Choledocholithiasis was not found in a nondilated       duct. Biliary sphincterotomy was made with a Hydratome sphincterotome       using ERBE electrocautery. we extended the sphincterotomy as far as we       safely could to the lateral fold but could only insert the half to  three-quarter bowed sphincterotome completelyand could not insert the       fully bowed sphincterotome but did not think we could cut any further at       this time There was no post-sphincterotomy bleeding. the sphincterotome       was removedand we exchanged it for the adjustable 12-15 mm balloon and       the biliary tree was swept with  a 12 mm balloon starting at the upper       third of the main bile duct. his was done 3 times Nothing was found.       nothing was delivered but there was some mild resistance in passing       theballoon through the patent sphincterotomy site and then we proceeded       with an occlusion cholangiogram which was normal and the balloon was       pulled through the ampulla and we elected to stop the procedure at this       point and the wire and theballoon was removed and because of her being       adamant not to have her gallbladder out and wanting to go back to workwe       elected to grab the pancreatic duct stentwith the biopsy forcepsand it       was removed and there was adequate biliary drainage and the scope was       removed Impression:               - The major papilla appeared to be bulging.                           - Choledocholithiasis was not found. the duct was                            swept multiple times as above.                           - One temporary stent was placed into the ventral                            pancreatic duct. and removed at the end of the                            procedure as above                           - A biliary sphincterotomy was performed.                           - The biliary tree was swept and nothing was found. Moderate Sedation:      N/A- Per Anesthesia Care Recommendation:           - Avoid aspirin and nonsteroidal anti-inflammatory                            medicines for 1 week. will given indomethacin                            suppository to  decrease pancreatitis risk                           - Continue present medications.                           - Return to GI clinic in 1 week. to recheck labs                            and have her follow-up with surgery in a few weeks                            to rediscuss the timing of laparoscopic                            cholecystectomy and Intra-Op cholangiogram                            - Telephone GI clinic if symptomatic PRN.                           - Clear liquid diet today. and if doing well by                            this evening may have soft solids and if doing well                            could possibly go home this evening but is not                            allowed to drive or use sharp objects today Procedure Code(s):        --- Professional ---                           [phone], Esophagogastroduodenoscopy, flexible,                            transoral; diagnostic, including collection of                            specimen(s) by brushing or washing, when performed                            (separate procedure) Diagnosis Code(s):        --- Professional ---                           K83.9, Disease of biliary tract, unspecified                           K80.50, Calculus of bile duct without cholangitis                            or cholecystitis without obstruction CPT copyright 2016 American [employer] Association. All  rights reserved. The codes documented in this report are preliminary and upon coder review may  be revised to meet current compliance requirements. Vida RiggerMarc Annalina Needles, MD Vida RiggerMarc Braydyn Schultes, MD 11/12/2015 12:58:14 PM This report has been signed electronically. Number of Addenda: 0

## 2015-11-12 NOTE — Anesthesia Preprocedure Evaluation (Addendum)
Anesthesia Evaluation  Patient identified by MRN, date of birth, ID band Patient awake    Reviewed: Allergy & Precautions, NPO status , Patient's Chart, lab work & pertinent test results  Airway Mallampati: II   Neck ROM: Full    Dental  (+) Teeth Intact, Dental Advisory Given   Pulmonary neg pulmonary ROS,    breath sounds clear to auscultation       Cardiovascular negative cardio ROS   Rhythm:Regular     Neuro/Psych negative neurological ROS  negative psych ROS   GI/Hepatic negative GI ROS, Neg liver ROS, Common BD Stone   Endo/Other  negative endocrine ROS  Renal/GU negative Renal ROS  negative genitourinary   Musculoskeletal negative musculoskeletal ROS (+)   Abdominal   Peds negative pediatric ROS (+)  Hematology negative hematology ROS (+)   Anesthesia Other Findings   Reproductive/Obstetrics negative OB ROS                            Anesthesia Physical Anesthesia Plan  ASA: I  Anesthesia Plan: General   Post-op Pain Management:    Induction: Intravenous  Airway Management Planned: Oral ETT  Additional Equipment:   Intra-op Plan:   Post-operative Plan: Extubation in OR  Informed Consent: I have reviewed the patients History and Physical, chart, labs and discussed the procedure including the risks, benefits and alternatives for the proposed anesthesia with the patient or authorized representative who has indicated his/her understanding and acceptance.     Plan Discussed with:   Anesthesia Plan Comments:         Anesthesia Quick Evaluation

## 2015-11-12 NOTE — Anesthesia Postprocedure Evaluation (Signed)
Anesthesia Post Note  Patient: Sonia Snyder  Procedure(s) Performed: Procedure(s) (LRB): ENDOSCOPIC RETROGRADE CHOLANGIOPANCREATOGRAPHY (ERCP) (N/A)  Patient location during evaluation: PACU Anesthesia Type: General Level of consciousness: sedated Pain management: satisfactory to patient Vital Signs Assessment: post-procedure vital signs reviewed and stable Respiratory status: spontaneous breathing Cardiovascular status: stable Anesthetic complications: no    Last Vitals:  Filed Vitals:   11/12/15 1352 11/12/15 1455  BP: 112/53 120/50  Pulse: 53 51  Temp: 37.1 C 36.5 C  Resp: 18 16    Last Pain:  Filed Vitals:   11/12/15 1456  PainSc: 0-No pain                 Joli Koob EDWARD

## 2015-11-12 NOTE — Progress Notes (Signed)
[facility] PROGRESS NOTE  Julianne Riceerquida Lahaie WUJ:[id number]:[id number] DOB: 1975-09-08 DOA: 11/11/2015 PCP: No primary care provider on file.  Assessment/Plan: 1. Choledocholithiasis -Mrs. Sonia Snyder is a 40 year old female with no significant past medical history presented with abdominal pain workup including an MRCP revealed the presence of choledocholithiasis. She was nontoxic, afebrile, having white count within normal range. -GI and general surgery were consulted -She underwent ERCP on 11/12/2015, tolerated procedure well no immediate complications. -Gen. surgery discussing laparoscopic cholecystectomy. She expressed wishes to undergo this procedure at a later date. She will follow-up with Dr. Ezzard StandingNewman in the outpatient setting   Code Status: Full code Family Communication: Family not present Disposition Plan: Anticipate discharge in the next 24 hours   Consultants:  GI  General surgery  HPI/Subjective: Mrs. Sonia Snyder is a pleasant 40 year old female with no significant past medical history admitted to medicine service on 11/11/2015 when she presented with complaints of abdominal pain that started 4 days prior to hospitalization. She was further worked up with an abdominal ultrasound that revealed cholelithiasis with mild bile duct dilatation in both intrahepatic and extrahepatic ducts. There was concern for choledocholithiasis for which she underwent MRCP. The study revealed common bile duct stones. GI and general surgery were consulted as she will underwent ERCP on 11/12/2015.  Objective: Filed Vitals:   11/12/15 1330 11/12/15 1352  BP: 140/70 112/53  Pulse: 50 53  Temp:  98.8 F (37.1 C)  Resp: 19 18    Intake/Output Summary (Last 24 hours) at 11/12/15 1447 Last data filed at 11/12/15 1400  Gross per 24 hour  Intake 2742.5 ml  Output    650 ml  Net 2092.5 ml   Filed Weights   11/11/15 0530  Weight: 70.308 kg (155 lb)    Exam:   General:  She is in no acute distress, awake and  alert mentating well  Cardiovascular: Regular rate and rhythm, normal S1-S2  Respiratory: Normal respiratory effort lungs are clear  Abdomen: Benign abdominal exam  Musculoskeletal: No edema  Data Reviewed: Basic Metabolic Panel:  Recent Labs Lab 11/11/15 0642 11/12/15 0430  NA 139 141  K 3.8 3.8  CL 106 107  CO2 23 21*  GLUCOSE 119* 83  BUN 6 8  CREATININE 0.63 0.63  CALCIUM 9.2 8.9   Liver Function Tests:  Recent Labs Lab 11/11/15 0642 11/12/15 0430  AST 50* 42*  ALT 249* 183*  ALKPHOS 117 109  BILITOT 5.1* 5.0*  PROT 7.7 7.2  ALBUMIN 4.2 3.6    Recent Labs Lab 11/11/15 0642  LIPASE 27   No results for input(s): AMMONIA in the last 168 hours. CBC:  Recent Labs Lab 11/11/15 0642 11/12/15 0430  WBC 4.4 5.5  NEUTROABS 2.8  --   HGB 12.9 11.4*  HCT 38.4 34.4*  MCV 85.3 86.0  PLT 240 244   Cardiac Enzymes: No results for input(s): CKTOTAL, CKMB, CKMBINDEX, TROPONINI in the last 168 hours. BNP (last 3 results) No results for input(s): BNP in the last 8760 hours.  ProBNP (last 3 results) No results for input(s): PROBNP in the last 8760 hours.  CBG: No results for input(s): GLUCAP in the last 168 hours.  No results found for this or any previous visit (from the past 240 hour(s)).   Studies: Mr 3d Recon At Scanner  11/11/2015  CLINICAL DATA:  Right upper quadrant pain. EXAM: MRI ABDOMEN WITHOUT AND WITH CONTRAST (INCLUDING MRCP) TECHNIQUE: Multiplanar multisequence MR imaging of the abdomen was performed both before and after the administration  of intravenous contrast. Heavily T2-weighted images of the biliary and pancreatic ducts were obtained, and three-dimensional MRCP images were rendered by post processing. CONTRAST:  14mL MULTIHANCE GADOBENATE DIMEGLUMINE 529 MG/ML IV SOLN COMPARISON:  Ultrasound 11/11/2015 FINDINGS: There is dilatation of intrahepatic and extrahepatic bile ducts, extending all way to the ampulla. At the level of the hepatic  artery, the duct measures 8 mm. Numerous calculi are present within the gallbladder lumen, most measuring from 3-7 mm. At the distal CBD there is image degradation, but suggestion of a faceted 3-4 mm stone at the ampulla. There is no focal liver lesion. The pancreas is normal. The spleen and kidneys are unremarkable. No upper abdominal ascites or adenopathy. No mass or inflammatory change. No effusions or other significant abnormality in the lower chest. Skeletal structures appear unremarkable. IMPRESSION: Dilatation of intrahepatic and extrahepatic bile ducts. Numerous 3-7 mm calculi within the gallbladder lumen. Probable calculus within the CBD at the ampulla, but there is mild image degradation at that level. No other explanation for the bile duct dilatation; specifically no evidence of mass or inflammation. Electronically Signed   By: Ellery Plunk M.D.   On: 11/11/2015 22:44   Dg Ercp Biliary & Pancreatic Ducts  11/12/2015  CLINICAL DATA:  ERCP with cholangiogram and biliary sweeping EXAM: ERCP TECHNIQUE: Multiple spot images obtained with the fluoroscopic device and submitted for interpretation post-procedure. COMPARISON:  MRCP - 11/11/2015 FINDINGS: Two spot intraoperative fluoroscopic images of the right upper abdominal quadrant during ERCP are provided for review. Initial image demonstrates an ERCP probe overlying the right upper abdominal quadrant. There is selective cannulation and opacification of the common bile duct. A biliary balloon is seen inflated within distal aspect of the CBD. There is minimal opacification of the intrahepatic biliary tree which appears nondilated. There is no opacification of the cystic or pancreatic ducts. No discrete filling defects are seen with the opacified portions of the biliary tree. IMPRESSION: ERCP as detailed above. These images were submitted for radiologic interpretation only. Please see the procedural report for the amount of contrast and the fluoroscopy  time utilized. Electronically Signed   By: Simonne Come M.D.   On: 11/12/2015 13:36   Mr Abd W/wo Cm/mrcp  11/11/2015  CLINICAL DATA:  Right upper quadrant pain. EXAM: MRI ABDOMEN WITHOUT AND WITH CONTRAST (INCLUDING MRCP) TECHNIQUE: Multiplanar multisequence MR imaging of the abdomen was performed both before and after the administration of intravenous contrast. Heavily T2-weighted images of the biliary and pancreatic ducts were obtained, and three-dimensional MRCP images were rendered by post processing. CONTRAST:  14mL MULTIHANCE GADOBENATE DIMEGLUMINE 529 MG/ML IV SOLN COMPARISON:  Ultrasound 11/11/2015 FINDINGS: There is dilatation of intrahepatic and extrahepatic bile ducts, extending all way to the ampulla. At the level of the hepatic artery, the duct measures 8 mm. Numerous calculi are present within the gallbladder lumen, most measuring from 3-7 mm. At the distal CBD there is image degradation, but suggestion of a faceted 3-4 mm stone at the ampulla. There is no focal liver lesion. The pancreas is normal. The spleen and kidneys are unremarkable. No upper abdominal ascites or adenopathy. No mass or inflammatory change. No effusions or other significant abnormality in the lower chest. Skeletal structures appear unremarkable. IMPRESSION: Dilatation of intrahepatic and extrahepatic bile ducts. Numerous 3-7 mm calculi within the gallbladder lumen. Probable calculus within the CBD at the ampulla, but there is mild image degradation at that level. No other explanation for the bile duct dilatation; specifically no evidence of mass or  inflammation. Electronically Signed   By: Ellery Plunk M.D.   On: 11/11/2015 22:44   US Abdomen Limited Ruq  11/11/2015  CLINICAL DATA:  Right upper quadrant pain back pain duration 3 days EXAM: US ABDOMEN LIMITED - RIGHT UPPER QUADRANT COMPARISON:  None. FINDINGS: Gallbladder: There are multiple calculi within the gallbladder. The largest calculus measures 9 mm. Borderline  gallbladder wall thickening. No pericholecystic fluid. Common bile duct: Diameter: 8 mm. Liver: There is mild intrahepatic bile duct dilatation. No focal liver lesions. IMPRESSION: Cholelithiasis. Mild bile duct dilatation, both intrahepatic and extrahepatic. This is concerning for possible choledocholithiasis. Electronically Signed   By: Ellery Plunk M.D.   On: 11/11/2015 06:45    Scheduled Meds: . heparin  5,000 Units Subcutaneous 3 times per day   Continuous Infusions: . sodium chloride 75 mL/hr at 11/12/15 0512  . lactated ringers 1,000 mL (11/12/15 1124)    Active Problems:   Choledocholithiasis    Time spent: 25 min    Sonia Snyder  [facility] Pager [phone]. If 7PM-7AM, please contact night-coverage at [email or web address], password [facility] 11/12/2015, 2:47 PM

## 2015-11-12 NOTE — Anesthesia Procedure Notes (Signed)
Procedure Name: Intubation Date/Time: 11/12/2015 11:36 AM Performed by: Epimenio SarinJARVELA, Yasaman Kolek R Pre-anesthesia Checklist: Patient identified, Emergency Drugs available, Suction available, Patient being monitored and Timeout performed Patient Re-evaluated:Patient Re-evaluated prior to inductionOxygen Delivery Method: Circle system utilized Preoxygenation: Pre-oxygenation with 100% oxygen Intubation Type: IV induction and Rapid sequence Laryngoscope Size: Mac and 3 Grade View: Grade I Tube type: Oral Tube size: 7.5 mm Number of attempts: 1 Airway Equipment and Method: Stylet Placement Confirmation: ETT inserted through vocal cords under direct vision,  positive ETCO2 and breath sounds checked- equal and bilateral Secured at: 21 cm Tube secured with: Tape Dental Injury: Teeth and Oropharynx as per pre-operative assessment

## 2015-11-12 NOTE — Transfer of Care (Signed)
Immediate Anesthesia Transfer of Care Note  Patient: Sonia Snyder  Procedure(s) Performed: Procedure(s): ENDOSCOPIC RETROGRADE CHOLANGIOPANCREATOGRAPHY (ERCP) (N/A)  Patient Location: ENDO  Anesthesia Type:General  Level of Consciousness:  sedated, patient cooperative and responds to stimulation  Airway & Oxygen Therapy:Patient Spontanous Breathing and Patient connected to face mask oxgen  Post-op Assessment:  Report given to ENDO RN and Post -op Vital signs reviewed and stable  Post vital signs:  Reviewed and stable  Last Vitals:  Filed Vitals:   11/12/15 1120 11/12/15 1122  BP:  132/77  Pulse: 80   Temp: 37.1 C   Resp: 15     Complications: No apparent anesthesia complications

## 2015-11-12 NOTE — Progress Notes (Signed)
Subjective: Feels fine no abdominal pain  Objective: Vital signs in last 24 hours: Temp:  [97.8 F (36.6 C)-98.4 F (36.9 C)] 98.4 F (36.9 C) (04/14 0456) Pulse Rate:  [65-89] 65 (04/14 0456) Resp:  [18-20] 18 (04/14 0456) BP: (105-123)/(59-69) 105/62 mmHg (04/14 0456) SpO2:  [97 %-100 %] 100 % (04/14 0456) Last BM Date: 11/08/15  Intake/Output from previous day: 04/13 0701 - 04/14 0700 In: 1310 [I.V.:1310] Out: 1250 [Urine:1250] Intake/Output this shift:    GI: soft, non-tender; bowel sounds normal; no masses,  no organomegaly  Lab Results:   Recent Labs  11/11/15 0642 11/12/15 0430  WBC 4.4 5.5  HGB 12.9 11.4*  HCT 38.4 34.4*  PLT 240 244   BMET  Recent Labs  11/11/15 0642 11/12/15 0430  NA 139 141  K 3.8 3.8  CL 106 107  CO2 23 21*  GLUCOSE 119* 83  BUN 6 8  CREATININE 0.63 0.63  CALCIUM 9.2 8.9   PT/INR No results for input(s): LABPROT, INR in the last 72 hours. ABG No results for input(s): PHART, HCO3 in the last 72 hours.  Invalid input(s): PCO2, PO2  Studies/Results: Mr 3d Recon At Scanner  11/11/2015  CLINICAL DATA:  Right upper quadrant pain. EXAM: MRI ABDOMEN WITHOUT AND WITH CONTRAST (INCLUDING MRCP) TECHNIQUE: Multiplanar multisequence MR imaging of the abdomen was performed both before and after the administration of intravenous contrast. Heavily T2-weighted images of the biliary and pancreatic ducts were obtained, and three-dimensional MRCP images were rendered by post processing. CONTRAST:  14mL MULTIHANCE GADOBENATE DIMEGLUMINE 529 MG/ML IV SOLN COMPARISON:  Ultrasound 11/11/2015 FINDINGS: There is dilatation of intrahepatic and extrahepatic bile ducts, extending all way to the ampulla. At the level of the hepatic artery, the duct measures 8 mm. Numerous calculi are present within the gallbladder lumen, most measuring from 3-7 mm. At the distal CBD there is image degradation, but suggestion of a faceted 3-4 mm stone at the ampulla. There  is no focal liver lesion. The pancreas is normal. The spleen and kidneys are unremarkable. No upper abdominal ascites or adenopathy. No mass or inflammatory change. No effusions or other significant abnormality in the lower chest. Skeletal structures appear unremarkable. IMPRESSION: Dilatation of intrahepatic and extrahepatic bile ducts. Numerous 3-7 mm calculi within the gallbladder lumen. Probable calculus within the CBD at the ampulla, but there is mild image degradation at that level. No other explanation for the bile duct dilatation; specifically no evidence of mass or inflammation. Electronically Signed   By: Ellery Plunk M.D.   On: 11/11/2015 22:44   Mr Abd W/wo Cm/mrcp  11/11/2015  CLINICAL DATA:  Right upper quadrant pain. EXAM: MRI ABDOMEN WITHOUT AND WITH CONTRAST (INCLUDING MRCP) TECHNIQUE: Multiplanar multisequence MR imaging of the abdomen was performed both before and after the administration of intravenous contrast. Heavily T2-weighted images of the biliary and pancreatic ducts were obtained, and three-dimensional MRCP images were rendered by post processing. CONTRAST:  14mL MULTIHANCE GADOBENATE DIMEGLUMINE 529 MG/ML IV SOLN COMPARISON:  Ultrasound 11/11/2015 FINDINGS: There is dilatation of intrahepatic and extrahepatic bile ducts, extending all way to the ampulla. At the level of the hepatic artery, the duct measures 8 mm. Numerous calculi are present within the gallbladder lumen, most measuring from 3-7 mm. At the distal CBD there is image degradation, but suggestion of a faceted 3-4 mm stone at the ampulla. There is no focal liver lesion. The pancreas is normal. The spleen and kidneys are unremarkable. No upper abdominal ascites or adenopathy. No  mass or inflammatory change. No effusions or other significant abnormality in the lower chest. Skeletal structures appear unremarkable. IMPRESSION: Dilatation of intrahepatic and extrahepatic bile ducts. Numerous 3-7 mm calculi within the  gallbladder lumen. Probable calculus within the CBD at the ampulla, but there is mild image degradation at that level. No other explanation for the bile duct dilatation; specifically no evidence of mass or inflammation. Electronically Signed   By: Ellery Plunkaniel R Mitchell M.D.   On: 11/11/2015 22:44   [place] Abdomen Limited Ruq  11/11/2015  CLINICAL DATA:  Right upper quadrant pain back pain duration 3 days EXAM: US ABDOMEN LIMITED - RIGHT UPPER QUADRANT COMPARISON:  None. FINDINGS: Gallbladder: There are multiple calculi within the gallbladder. The largest calculus measures 9 mm. Borderline gallbladder wall thickening. No pericholecystic fluid. Common bile duct: Diameter: 8 mm. Liver: There is mild intrahepatic bile duct dilatation. No focal liver lesions. IMPRESSION: Cholelithiasis. Mild bile duct dilatation, both intrahepatic and extrahepatic. This is concerning for possible choledocholithiasis. Electronically Signed   By: Ellery Plunkaniel R Mitchell M.D.   On: 11/11/2015 06:45    Anti-infectives: Anti-infectives    None      Assessment/Plan: Patient Active Problem List   Diagnosis Date Noted  . Choledocholithiasis 11/11/2015  Pt feels well MRCP shows probable distal CBD stone Discussed laparoscopic cholecystectomy with the patient and the reason it is recommended She agrees to proceed with ERCP but does not want LGB at this time She understands the risks of delaying this She can be D/C after ERCP and follow up with Dr Ezzard StandingNewman as outpatient to discuss LGB further       Amiree No A. 11/12/2015

## 2015-11-12 NOTE — Progress Notes (Signed)
Sonia RicePerquida Snyder 11:25 AM  Subjective: Patient doing fine without any new complaints and we reviewed the surgical recommendations as well as her MRCP  Objective: Vital signs stable afebrile no acute distress exam please see preassessment evaluation labs minimally decreased white count okay MRCP noted and reviewed  Assessment: Probable CBD stones  Plan: Okay to proceed with ERCP with anesthesia assistance today and if no delayed complications and eating fine after the evening meal and truly not wanting surgery possibly could go home later today  [facility],Sonia Snyder  Pager [phone] After 5PM or if no answer call [phone]

## 2015-11-13 LAB — COMPREHENSIVE METABOLIC PANEL
ALBUMIN: 3.4 g/dL — AB (ref 3.5–5.0)
ALK PHOS: 112 U/L (ref 38–126)
ALT: 148 U/L — ABNORMAL HIGH (ref 14–54)
ANION GAP: 12 (ref 5–15)
AST: 41 U/L (ref 15–41)
BUN: 9 mg/dL (ref 6–20)
CO2: 20 mmol/L — AB (ref 22–32)
Calcium: 8.5 mg/dL — ABNORMAL LOW (ref 8.9–10.3)
Chloride: 108 mmol/L (ref 101–111)
Creatinine, Ser: 0.59 mg/dL (ref 0.44–1.00)
GFR calc Af Amer: >60 mL/min (ref >60)
GFR calc non Af Amer: >60 mL/min (ref >60)
GLUCOSE: 184 mg/dL — AB (ref 65–99)
POTASSIUM: 3.8 mmol/L (ref 3.5–5.1)
SODIUM: 140 mmol/L (ref 135–145)
Total Bilirubin: 3.3 mg/dL — ABNORMAL HIGH (ref 0.3–1.2)
Total Protein: 6.6 g/dL (ref 6.5–8.1)

## 2015-11-13 LAB — CBC WITH DIFFERENTIAL/PLATELET
Basophils Absolute: 0 10*3/uL (ref 0.0–0.1)
Basophils Relative: 0 %
EOS ABS: 0 10*3/uL (ref 0.0–0.7)
Eosinophils Relative: 0 %
HEMATOCRIT: 40.8 % (ref 36.0–46.0)
HEMOGLOBIN: 13.7 g/dL (ref 12.0–15.0)
Lymphocytes Relative: 5 %
Lymphs Abs: 0.6 10*3/uL — ABNORMAL LOW (ref 0.7–4.0)
MCH: 28.4 pg (ref 26.0–34.0)
MCHC: 33.6 g/dL (ref 30.0–36.0)
MCV: 84.6 fL (ref 78.0–100.0)
MONOS PCT: 6 %
Monocytes Absolute: 0.8 10*3/uL (ref 0.1–1.0)
NEUTROS ABS: 10.9 10*3/uL — AB (ref 1.7–7.7)
NEUTROS PCT: 89 %
Platelets: 297 10*3/uL (ref 150–400)
RBC: 4.82 MIL/uL (ref 3.87–5.11)
RDW: 13 % (ref 11.5–15.5)
WBC: 12.2 10*3/uL — AB (ref 4.0–10.5)

## 2015-11-13 MED ORDER — OXYCODONE HCL 5 MG PO TABS: 5.0000 mg | ORAL_TABLET | Freq: Four times a day (QID) | ORAL | Status: AC | PRN

## 2015-11-13 MED ORDER — ONDANSETRON HCL 4 MG PO TABS: 4.0000 mg | ORAL_TABLET | Freq: Three times a day (TID) | ORAL | Status: AC | PRN

## 2015-11-13 NOTE — Progress Notes (Signed)
Patient ID: Sonia Snyder, female   DOB: 03-09-1976, 40 y.o.   MRN: [id number]  General Surgery [facility]  Subjective: HD#3 - patient with one episode emesis after ERCP yesterday.  Better now.  Sore.  Objective: Vital signs in last 24 hours: Temp:  [97.4 F (36.3 C)-99.6 F (37.6 C)] 98.2 F (36.8 C) (04/15 0443) Pulse Rate:  [50-80] 79 (04/15 0443) Resp:  [13-20] 16 (04/15 0443) BP: (112-145)/(50-77) 127/75 mmHg (04/15 0443) SpO2:  [97 %-100 %] 97 % (04/15 0443) Last BM Date: 11/08/15  Intake/Output from previous day: 04/14 0701 - 04/15 0700 In: 2680 [P.O.:120; I.V.:2560] Out: 2150 [Urine:2150] Intake/Output this shift:    Physical Exam: HEENT - sclerae clear, mucous membranes moist Abdomen - soft without distension; mild tenderness Ext - no edema, non-tender Neuro - alert & oriented, no focal deficits  Lab Results:   Recent Labs  11/12/15 0430 11/13/15 0429  WBC 5.5 12.2*  HGB 11.4* 13.7  HCT 34.4* 40.8  PLT 244 297   BMET  Recent Labs  11/12/15 0430 11/13/15 0429  NA 141 140  K 3.8 3.8  CL 107 108  CO2 21* 20*  GLUCOSE 83 184*  BUN 8 9  CREATININE 0.63 0.59  CALCIUM 8.9 8.5*   PT/INR No results for input(s): LABPROT, INR in the last 72 hours. Comprehensive Metabolic Panel:    Component Value Date/Time   NA 140 11/13/2015 0429   NA 141 11/12/2015 0430   K 3.8 11/13/2015 0429   K 3.8 11/12/2015 0430   CL 108 11/13/2015 0429   CL 107 11/12/2015 0430   CO2 20* 11/13/2015 0429   CO2 21* 11/12/2015 0430   BUN 9 11/13/2015 0429   BUN 8 11/12/2015 0430   CREATININE 0.59 11/13/2015 0429   CREATININE 0.63 11/12/2015 0430   GLUCOSE 184* 11/13/2015 0429   GLUCOSE 83 11/12/2015 0430   CALCIUM 8.5* 11/13/2015 0429   CALCIUM 8.9 11/12/2015 0430   AST 41 11/13/2015 0429   AST 42* 11/12/2015 0430   ALT 148* 11/13/2015 0429   ALT 183* 11/12/2015 0430   ALKPHOS 112 11/13/2015 0429   ALKPHOS 109 11/12/2015 0430   BILITOT 3.3*  11/13/2015 0429   BILITOT 5.0* 11/12/2015 0430   PROT 6.6 11/13/2015 0429   PROT 7.2 11/12/2015 0430   ALBUMIN 3.4* 11/13/2015 0429   ALBUMIN 3.6 11/12/2015 0430    Studies/Results: Mr 3d Recon At Scanner  11/11/2015  CLINICAL DATA:  Right upper quadrant pain. EXAM: MRI ABDOMEN WITHOUT AND WITH CONTRAST (INCLUDING MRCP) TECHNIQUE: Multiplanar multisequence MR imaging of the abdomen was performed both before and after the administration of intravenous contrast. Heavily T2-weighted images of the biliary and pancreatic ducts were obtained, and three-dimensional MRCP images were rendered by post processing. CONTRAST:  14mL MULTIHANCE GADOBENATE DIMEGLUMINE 529 MG/ML IV SOLN COMPARISON:  Ultrasound 11/11/2015 FINDINGS: There is dilatation of intrahepatic and extrahepatic bile ducts, extending all way to the ampulla. At the level of the hepatic artery, the duct measures 8 mm. Numerous calculi are present within the gallbladder lumen, most measuring from 3-7 mm. At the distal CBD there is image degradation, but suggestion of a faceted 3-4 mm stone at the ampulla. There is no focal liver lesion. The pancreas is normal. The spleen and kidneys are unremarkable. No upper abdominal ascites or adenopathy. No mass or inflammatory change. No effusions or other significant abnormality in the lower chest. Skeletal structures appear unremarkable. IMPRESSION: Dilatation of intrahepatic and extrahepatic bile ducts.  Numerous 3-7 mm calculi within the gallbladder lumen. Probable calculus within the CBD at the ampulla, but there is mild image degradation at that level. No other explanation for the bile duct dilatation; specifically no evidence of mass or inflammation. Electronically Signed   By: Ellery Plunk M.D.   On: 11/11/2015 22:44   Dg Ercp Biliary & Pancreatic Ducts  11/12/2015  CLINICAL DATA:  ERCP with cholangiogram and biliary sweeping EXAM: ERCP TECHNIQUE: Multiple spot images obtained with the fluoroscopic  device and submitted for interpretation post-procedure. COMPARISON:  MRCP - 11/11/2015 FINDINGS: Two spot intraoperative fluoroscopic images of the right upper abdominal quadrant during ERCP are provided for review. Initial image demonstrates an ERCP probe overlying the right upper abdominal quadrant. There is selective cannulation and opacification of the common bile duct. A biliary balloon is seen inflated within distal aspect of the CBD. There is minimal opacification of the intrahepatic biliary tree which appears nondilated. There is no opacification of the cystic or pancreatic ducts. No discrete filling defects are seen with the opacified portions of the biliary tree. IMPRESSION: ERCP as detailed above. These images were submitted for radiologic interpretation only. Please see the procedural report for the amount of contrast and the fluoroscopy time utilized. Electronically Signed   By: Simonne Come M.D.   On: 11/12/2015 13:36   Mr Abd W/wo Cm/mrcp  11/11/2015  CLINICAL DATA:  Right upper quadrant pain. EXAM: MRI ABDOMEN WITHOUT AND WITH CONTRAST (INCLUDING MRCP) TECHNIQUE: Multiplanar multisequence MR imaging of the abdomen was performed both before and after the administration of intravenous contrast. Heavily T2-weighted images of the biliary and pancreatic ducts were obtained, and three-dimensional MRCP images were rendered by post processing. CONTRAST:  14mL MULTIHANCE GADOBENATE DIMEGLUMINE 529 MG/ML IV SOLN COMPARISON:  Ultrasound 11/11/2015 FINDINGS: There is dilatation of intrahepatic and extrahepatic bile ducts, extending all way to the ampulla. At the level of the hepatic artery, the duct measures 8 mm. Numerous calculi are present within the gallbladder lumen, most measuring from 3-7 mm. At the distal CBD there is image degradation, but suggestion of a faceted 3-4 mm stone at the ampulla. There is no focal liver lesion. The pancreas is normal. The spleen and kidneys are unremarkable. No upper  abdominal ascites or adenopathy. No mass or inflammatory change. No effusions or other significant abnormality in the lower chest. Skeletal structures appear unremarkable. IMPRESSION: Dilatation of intrahepatic and extrahepatic bile ducts. Numerous 3-7 mm calculi within the gallbladder lumen. Probable calculus within the CBD at the ampulla, but there is mild image degradation at that level. No other explanation for the bile duct dilatation; specifically no evidence of mass or inflammation. Electronically Signed   By: Ellery Plunk M.D.   On: 11/11/2015 22:44    Assessment & Plans: Cholelithiasis, rule out choledocholithiasis  ERCP findings discussed with Dr. Ewing Schlein and op note reviewed  Agree with advancing diet  Patient refuses lap chole at this time  Will arrange follow up with Dr. Ovidio Kin at [facility] office 1-2 weeks  Discharge plans per medical team  Velora Heckler, MD, [facility] Office: [phone]   Moesha Sarchet Judie Petit 11/13/2015

## 2015-11-13 NOTE — Progress Notes (Signed)
Quincy Fosse 10:15 AM  Subjective: Patient had some nausea vomiting last night but doing better this morning and no new complaints and we rediscussed her procedure and her case discussed with the surgical team  Objective: Vital signs stable afebrile no acute distress abdomen still little sore but soft occasional bowel sounds decreased liver tests increased white count little  Assessment: Probable passed CBD stone  Plan: Okay to slowly advance diet and go home soon if able and repeat liver tests in 1 week in my office  [facility],Tenelle Andreason E  Pager [phone] After 5PM or if no answer call [phone]

## 2015-11-13 NOTE — Discharge Summary (Signed)
Physician Discharge Summary  Sonia Snyder ZOX:[id number] DOB: 25-Jan-1976 DOA: 11/11/2015  PCP: No primary care provider on file.  Admit date: 11/11/2015 Discharge date: 11/13/2015  Time spent: 35 minutes  Recommendations for Outpatient Follow-up:  1. Sonia Snyder admitted for choledocholithiasis, in general surgery involved in her care during this hospitalization. She underwent ERCP. Gen. surgery recommending cholecystectomy which she preferred to have the outpatient setting. He was given follow-up upon with [facility]   Discharge Diagnoses:  Active Problems:   Choledocholithiasis   Discharge Condition: Stable  Diet recommendation: Regular Diet  Filed Weights   11/11/15 0530  Weight: 70.308 kg (155 lb)    History of present illness:  Sonia Snyder is a 40 y.o. female with no significant past medical history comes into the hospital for abdominal pain that started 4 days prior to admission she relates it would be intermittent, and improved with drinking water. Nothing made it worse. The day prior to admission she started having severe abdominal pain with radiation to her back progressively getting worst anorexic. She took 2 ibuprofen and came to the ED. She denies any fever, chills, nausea, vomiting or diarrhea  Hospital Course:  Sonia Snyder is a pleasant 40 year old female with no significant past medical history admitted to medicine service on 11/11/2015 when she presented with complaints of abdominal pain that started 4 days prior to hospitalization. She was further worked up with an abdominal ultrasound that revealed cholelithiasis with mild bile duct dilatation in both intrahepatic and extrahepatic ducts. There was concern for choledocholithiasis for which she underwent MRCP. The study revealed common bile duct stones. GI and general surgery were consulted as she underwent ERCP on 11/12/2015. She tolerated procedure well there no immediate complications. During this  hospitalization she was evaluated by general surgery recommending laparoscopic cholecystectomy. She expressed wishes to not undergo this procedure now and would follow-up with [facility] in the outpatient setting.  Procedures:  ERCP performed on 11/12/2015  Consultations:  General surgery  GI  Discharge Exam: Filed Vitals:   11/12/15 2129 11/13/15 0443  BP: 121/76 127/75  Pulse: 62 79  Temp: 99.6 F (37.6 C) 98.2 F (36.8 C)  Resp: 20 16     General: She is in no acute distress, awake and alert mentating well  Cardiovascular: Regular rate and rhythm, normal S1-S2  Respiratory: Normal respiratory effort lungs are clear  Abdomen: Benign abdominal exam  Musculoskeletal: No edema  Discharge Instructions   Discharge Instructions    Call MD for:  difficulty breathing, headache or visual disturbances    Complete by:  As directed      Call MD for:  extreme fatigue    Complete by:  As directed      Call MD for:  hives    Complete by:  As directed      Call MD for:  persistant dizziness or light-headedness    Complete by:  As directed      Call MD for:  persistant nausea and vomiting    Complete by:  As directed      Call MD for:  redness, tenderness, or signs of infection (pain, swelling, redness, odor or green/yellow discharge around incision site)    Complete by:  As directed      Call MD for:  severe uncontrolled pain    Complete by:  As directed      Call MD for:  temperature >100.4    Complete by:  As directed  Call MD for:    Complete by:  As directed      Diet - low sodium heart healthy    Complete by:  As directed      Increase activity slowly    Complete by:  As directed           Current Discharge Medication List    START taking these medications   Details  ondansetron (ZOFRAN) 4 MG tablet Take 1 tablet (4 mg total) by mouth every 8 (eight) hours as needed for nausea or vomiting. Qty: 20 tablet, Refills: 0    oxyCODONE  (ROXICODONE) 5 MG immediate release tablet Take 1 tablet (5 mg total) by mouth every 6 (six) hours as needed for severe pain. Qty: 10 tablet, Refills: 0      CONTINUE these medications which have NOT CHANGED   Details  ibuprofen (ADVIL,MOTRIN) 200 MG tablet Take 400 mg by mouth every 6 (six) hours as needed for headache, mild pain or moderate pain.       No Known Allergies Follow-up Information    Follow up with [facility] E, MD In 1 week.   Specialty:  Gastroenterology   Contact information:   [address] [place] [address] [phone]       Follow up with [facility] H, MD In 1 week.   Specialty:  General Surgery   Contact information:   [address] [place] [address] [phone]        The results of significant diagnostics from this hospitalization (including imaging, microbiology, ancillary and laboratory) are listed below for reference.    Significant Diagnostic Studies: Mr 3d Recon At Scanner  11-18-15  CLINICAL DATA:  Right upper quadrant pain. EXAM: MRI ABDOMEN WITHOUT AND WITH CONTRAST (INCLUDING MRCP) TECHNIQUE: Multiplanar multisequence MR imaging of the abdomen was performed both before and after the administration of intravenous contrast. Heavily T2-weighted images of the biliary and pancreatic ducts were obtained, and three-dimensional MRCP images were rendered by post processing. CONTRAST:  14mL MULTIHANCE GADOBENATE DIMEGLUMINE 529 MG/ML IV SOLN COMPARISON:  Ultrasound 11/18/2015 FINDINGS: There is dilatation of intrahepatic and extrahepatic bile ducts, extending all way to the ampulla. At the level of the hepatic artery, the duct measures 8 mm. Numerous calculi are present within the gallbladder lumen, most measuring from 3-7 mm. At the distal CBD there is image degradation, but suggestion of a faceted 3-4 mm stone at the ampulla. There is no focal liver lesion. The pancreas is normal. The spleen and kidneys are unremarkable.  No upper abdominal ascites or adenopathy. No mass or inflammatory change. No effusions or other significant abnormality in the lower chest. Skeletal structures appear unremarkable. IMPRESSION: Dilatation of intrahepatic and extrahepatic bile ducts. Numerous 3-7 mm calculi within the gallbladder lumen. Probable calculus within the CBD at the ampulla, but there is mild image degradation at that level. No other explanation for the bile duct dilatation; specifically no evidence of mass or inflammation. Electronically Signed   By: Ellery Plunk M.D.   On: 11-18-2015 22:44   Dg Ercp Biliary & Pancreatic Ducts  11/12/2015  CLINICAL DATA:  ERCP with cholangiogram and biliary sweeping EXAM: ERCP TECHNIQUE: Multiple spot images obtained with the fluoroscopic device and submitted for interpretation post-procedure. COMPARISON:  MRCP - 11/18/2015 FINDINGS: Two spot intraoperative fluoroscopic images of the right upper abdominal quadrant during ERCP are provided for review. Initial image demonstrates an ERCP probe overlying the right upper abdominal quadrant. There is selective cannulation  and opacification of the common bile duct. A biliary balloon is seen inflated within distal aspect of the CBD. There is minimal opacification of the intrahepatic biliary tree which appears nondilated. There is no opacification of the cystic or pancreatic ducts. No discrete filling defects are seen with the opacified portions of the biliary tree. IMPRESSION: ERCP as detailed above. These images were submitted for radiologic interpretation only. Please see the procedural report for the amount of contrast and the fluoroscopy time utilized. Electronically Signed   By: Simonne Come M.D.   On: 11/12/2015 13:36   Mr Abd W/wo Cm/mrcp  11/11/2015  CLINICAL DATA:  Right upper quadrant pain. EXAM: MRI ABDOMEN WITHOUT AND WITH CONTRAST (INCLUDING MRCP) TECHNIQUE: Multiplanar multisequence MR imaging of the abdomen was performed both before and  after the administration of intravenous contrast. Heavily T2-weighted images of the biliary and pancreatic ducts were obtained, and three-dimensional MRCP images were rendered by post processing. CONTRAST:  14mL MULTIHANCE GADOBENATE DIMEGLUMINE 529 MG/ML IV SOLN COMPARISON:  Ultrasound 11/11/2015 FINDINGS: There is dilatation of intrahepatic and extrahepatic bile ducts, extending all way to the ampulla. At the level of the hepatic artery, the duct measures 8 mm. Numerous calculi are present within the gallbladder lumen, most measuring from 3-7 mm. At the distal CBD there is image degradation, but suggestion of a faceted 3-4 mm stone at the ampulla. There is no focal liver lesion. The pancreas is normal. The spleen and kidneys are unremarkable. No upper abdominal ascites or adenopathy. No mass or inflammatory change. No effusions or other significant abnormality in the lower chest. Skeletal structures appear unremarkable. IMPRESSION: Dilatation of intrahepatic and extrahepatic bile ducts. Numerous 3-7 mm calculi within the gallbladder lumen. Probable calculus within the CBD at the ampulla, but there is mild image degradation at that level. No other explanation for the bile duct dilatation; specifically no evidence of mass or inflammation. Electronically Signed   By: Ellery Plunk M.D.   On: 11/11/2015 22:44   US Abdomen Limited Ruq  11/11/2015  CLINICAL DATA:  Right upper quadrant pain back pain duration 3 days EXAM: US ABDOMEN LIMITED - RIGHT UPPER QUADRANT COMPARISON:  None. FINDINGS: Gallbladder: There are multiple calculi within the gallbladder. The largest calculus measures 9 mm. Borderline gallbladder wall thickening. No pericholecystic fluid. Common bile duct: Diameter: 8 mm. Liver: There is mild intrahepatic bile duct dilatation. No focal liver lesions. IMPRESSION: Cholelithiasis. Mild bile duct dilatation, both intrahepatic and extrahepatic. This is concerning for possible choledocholithiasis.  Electronically Signed   By: Ellery Plunk M.D.   On: 11/11/2015 06:45    Microbiology: No results found for this or any previous visit (from the past 240 hour(s)).   Labs: Basic Metabolic Panel:  Recent Labs Lab 11/11/15 0642 11/12/15 0430 11/13/15 0429  NA 139 141 140  K 3.8 3.8 3.8  CL 106 107 108  CO2 23 21* 20*  GLUCOSE 119* 83 184*  BUN CREATININE 0.63 0.63 0.59  CALCIUM 9.2 8.9 8.5*   Liver Function Tests:  Recent Labs Lab 11/11/15 0642 11/12/15 0430 11/13/15 0429  AST 50* 42* 41  ALT 249* 183* 148*  ALKPHOS 117 109 112  BILITOT 5.1* 5.0* 3.3*  PROT 7.7 7.2 6.6  ALBUMIN 4.2 3.6 3.4*    Recent Labs Lab 11/11/15 0642  LIPASE 27   No results for input(s): AMMONIA in the last 168 hours. CBC:  Recent Labs Lab 11/11/15 0642 11/12/15 0430 11/13/15 0429  WBC 4.4 5.5 12.2*  NEUTROABS 2.8  --  10.9*  HGB 12.9 11.4* 13.7  HCT 38.4 34.4* 40.8  MCV 85.3 86.0 84.6  PLT 240 244 297   Cardiac Enzymes: No results for input(s): CKTOTAL, CKMB, CKMBINDEX, TROPONINI in the last 168 hours. BNP: BNP (last 3 results) No results for input(s): BNP in the last 8760 hours.  ProBNP (last 3 results) No results for input(s): PROBNP in the last 8760 hours.  CBG: No results for input(s): GLUCAP in the last 168 hours.     Signed:  Jeralyn BennettZAMORA, Airanna Partin MD.  [facility] 11/13/2015, 12:21 PM

## 2015-11-13 NOTE — Progress Notes (Signed)
Discharge instructions and prescriptions reviewed/given to patient.  Patient verbalizes understanding.

## 2015-11-15 ENCOUNTER — Encounter (HOSPITAL_COMMUNITY): Payer: Self-pay | Admitting: Gastroenterology

## 2015-11-24 NOTE — ED Provider Notes (Signed)
CSN: [id number]     Arrival date & time 11/11/15  0300 History   First MD Initiated Contact with Patient 11/11/15 0534     Chief Complaint  Patient presents with  . Flank Pain     (Consider location/radiation/quality/duration/timing/severity/associated sxs/prior Treatment) HPI Comments: Patient presented to the ER complaining of severe right flank pain that started several days prior to arrival in the ER. Pain is constant and she has not noticed any alleviating or exacerbating factors.  Patient is a 40 y.o. female presenting with flank pain.  Flank Pain    History reviewed. No pertinent past medical history. Past Surgical History  Procedure Laterality Date  . Ercp N/A 11/12/2015    Procedure: ENDOSCOPIC RETROGRADE CHOLANGIOPANCREATOGRAPHY (ERCP);  Surgeon: Vida Rigger, MD;  Location: [facility];  Service: Endoscopy;  Laterality: N/A;   Family History  Problem Relation Age of Onset  . Diabetes Mellitus II Mother   . Hypertension Mother    Social History  Substance Use Topics  . Smoking status: Never Smoker   . Smokeless tobacco: None  . Alcohol Use: No   OB History    No data available     Review of Systems  Genitourinary: Positive for flank pain.  All other systems reviewed and are negative.     Allergies  Review of patient's allergies indicates no known allergies.  Home Medications   Prior to Admission medications   Medication Sig Start Date End Date Taking? Authorizing Provider  ibuprofen (ADVIL,MOTRIN) 200 MG tablet Take 400 mg by mouth every 6 (six) hours as needed for headache, mild pain or moderate pain.   Yes Historical Provider, MD  ondansetron (ZOFRAN) 4 MG tablet Take 1 tablet (4 mg total) by mouth every 8 (eight) hours as needed for nausea or vomiting. 11/13/15   Jeralyn Bennett, MD  oxyCODONE (ROXICODONE) 5 MG immediate release tablet Take 1 tablet (5 mg total) by mouth every 6 (six) hours as needed for severe pain. 11/13/15   Jeralyn Bennett, MD   BP  143/80 mmHg  Pulse 81  Temp(Src) 99.1 F (37.3 C) (Oral)  Resp 18  Ht  (1.702 m)  Wt 155 lb (70.308 kg)  BMI 24.27 kg/m2  SpO2 100%  LMP 11/11/2015 (Exact Date) Physical Exam  Constitutional: She is oriented to person, place, and time. She appears well-developed and well-nourished. No distress.  HENT:  Head: Normocephalic and atraumatic.  Right Ear: Hearing normal.  Left Ear: Hearing normal.  Nose: Nose normal.  Mouth/Throat: Oropharynx is clear and moist and mucous membranes are normal.  Eyes: Conjunctivae and EOM are normal. Pupils are equal, round, and reactive to light.  Neck: Normal range of motion. Neck supple.  Cardiovascular: Regular rhythm, S1 normal and S2 normal.  Exam reveals no gallop and no friction rub.   No murmur heard. Pulmonary/Chest: Effort normal and breath sounds normal. No respiratory distress. She exhibits no tenderness.  Abdominal: Soft. Normal appearance and bowel sounds are normal. There is no hepatosplenomegaly. There is tenderness in the right upper quadrant and epigastric area. There is no rebound, no guarding, no tenderness at McBurney's point and negative Murphy's sign. No hernia.  Musculoskeletal: Normal range of motion.  Neurological: She is alert and oriented to person, place, and time. She has normal strength. No cranial nerve deficit or sensory deficit. Coordination normal. GCS eye subscore is 4. GCS verbal subscore is 5. GCS motor subscore is 6.  Skin: Skin is warm, dry and intact. No rash noted. No cyanosis.  Psychiatric: She has a normal mood and affect. Her speech is normal and behavior is normal. Thought content normal.  Nursing note and vitals reviewed.   ED Course  Procedures (including critical care time) Labs Review Labs Reviewed  URINALYSIS, ROUTINE W REFLEX MICROSCOPIC (NOT AT [facility]) - Abnormal; Notable for the following:    Color, Urine AMBER (*)    APPearance CLOUDY (*)    Hgb urine dipstick LARGE (*)    Bilirubin Urine  MODERATE (*)    Ketones, ur 15 (*)    All other components within normal limits  COMPREHENSIVE METABOLIC PANEL - Abnormal; Notable for the following:    Glucose, Bld 119 (*)    AST 50 (*)    ALT 249 (*)    Total Bilirubin 5.1 (*)    All other components within normal limits  URINE MICROSCOPIC-ADD ON - Abnormal; Notable for the following:    Squamous Epithelial / LPF 0-5 (*)    Bacteria, UA RARE (*)    All other components within normal limits  CBC - Abnormal; Notable for the following:    Hemoglobin 11.4 (*)    HCT 34.4 (*)    All other components within normal limits  COMPREHENSIVE METABOLIC PANEL - Abnormal; Notable for the following:    CO2 21 (*)    AST 42 (*)    ALT 183 (*)    Total Bilirubin 5.0 (*)    All other components within normal limits  CBC WITH DIFFERENTIAL/PLATELET - Abnormal; Notable for the following:    WBC 12.2 (*)    Neutro Abs 10.9 (*)    Lymphs Abs 0.6 (*)    All other components within normal limits  COMPREHENSIVE METABOLIC PANEL - Abnormal; Notable for the following:    CO2 20 (*)    Glucose, Bld 184 (*)    Calcium 8.5 (*)    Albumin 3.4 (*)    ALT 148 (*)    Total Bilirubin 3.3 (*)    All other components within normal limits  CBC WITH DIFFERENTIAL/PLATELET  LIPASE, BLOOD  I-STAT BETA HCG BLOOD, ED ([facility], [facility], [facility] ONLY)    Imaging Review No results found. I have personally reviewed and evaluated these images and lab results as part of my medical decision-making.   EKG Interpretation None      MDM   Final diagnoses:  Right upper quadrant pain  Choledocholithiasis    Patient presented to the ER for evaluation of abdominal and flank pain. She had abnormal LFTs and an ultrasound that identified cholelithiasis with mild bile duct dilatation. Patient was signed out to oncoming ER physician to obtain appropriate consultations and determine disposition.    Gilda Creasehristopher J Pollina, MD 11/24/15 [phone]

## 2017-03-17 IMAGING — US US ABDOMEN LIMITED
1 series · 14 of 25 positions shown · non-contrast
Comparison: None.

CLINICAL DATA: Right upper quadrant pain back pain duration 3 days

EXAM:
US ABDOMEN LIMITED - RIGHT UPPER QUADRANT

[Series 1: us abdomen limited · 0.23mm/px · 14 of 59 slices shown]
[im 1/59]
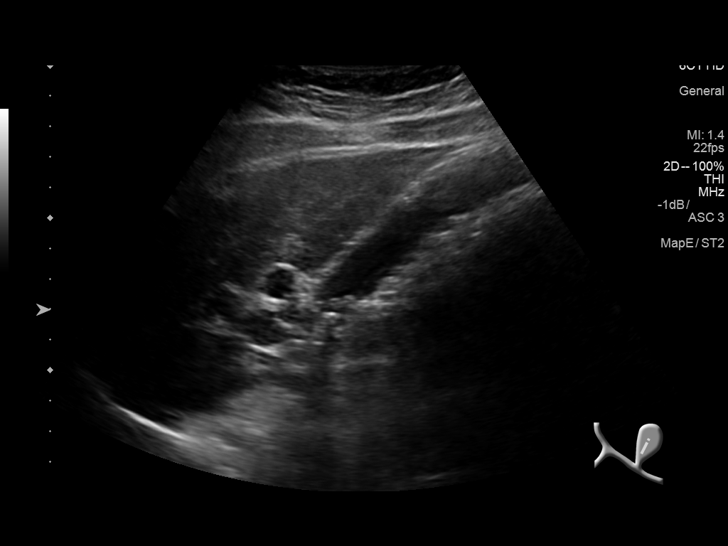
[im 5/59]
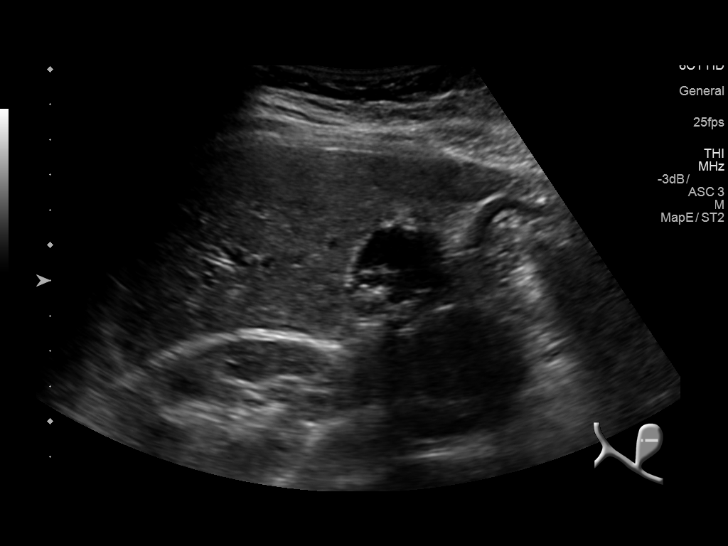
[im 10/59]
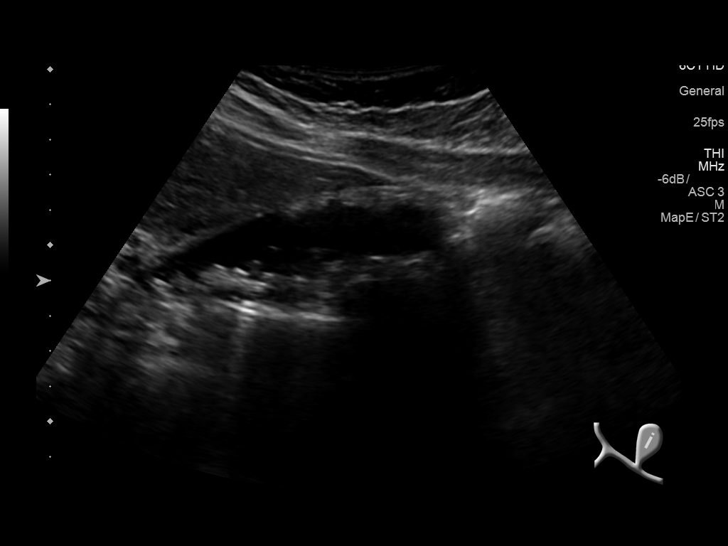
[im 15/59]
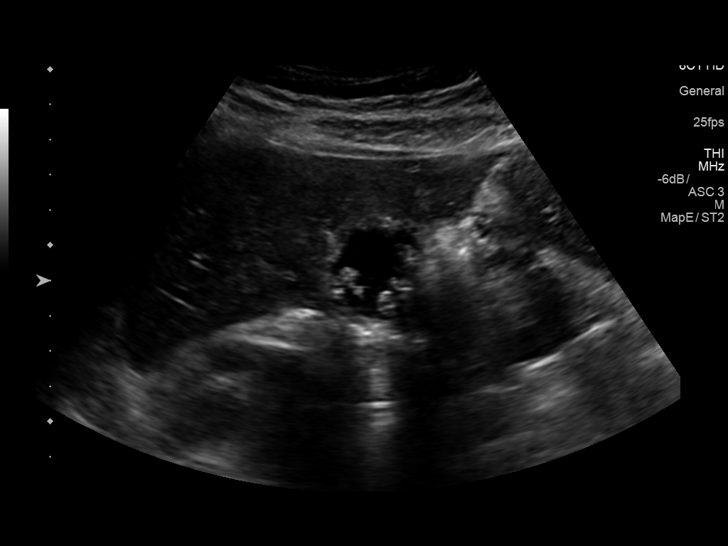
[im 20/59]
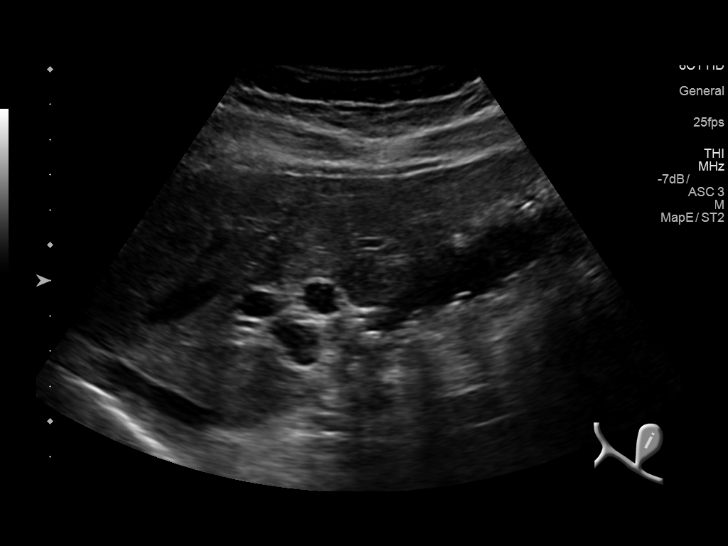
[im 22/59]
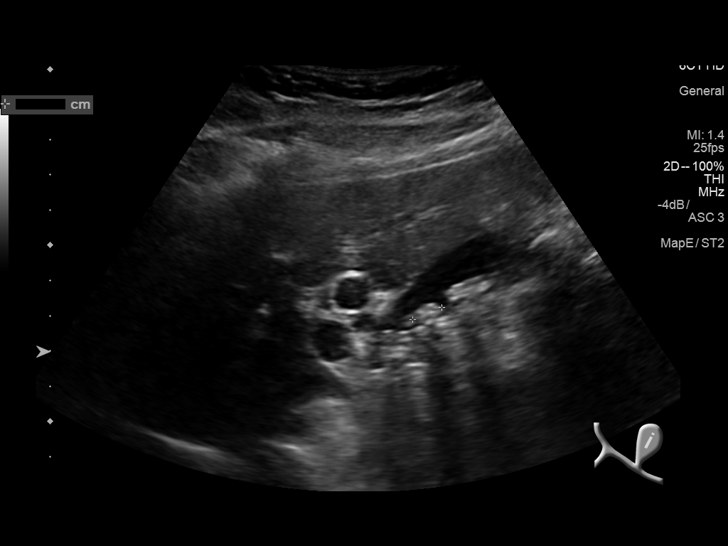
[im 27/59]
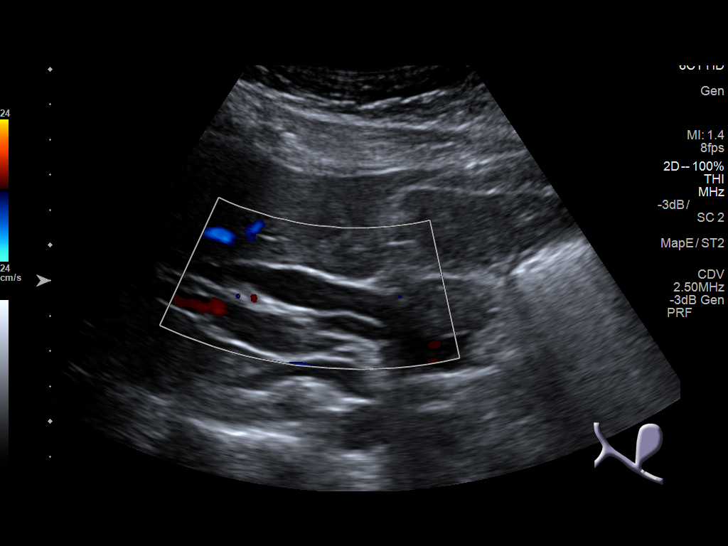
[im 32/59]
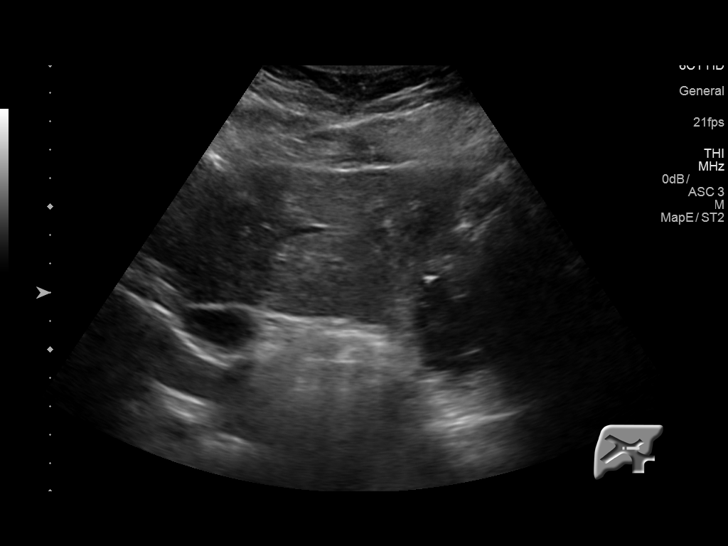
[im 37/59]
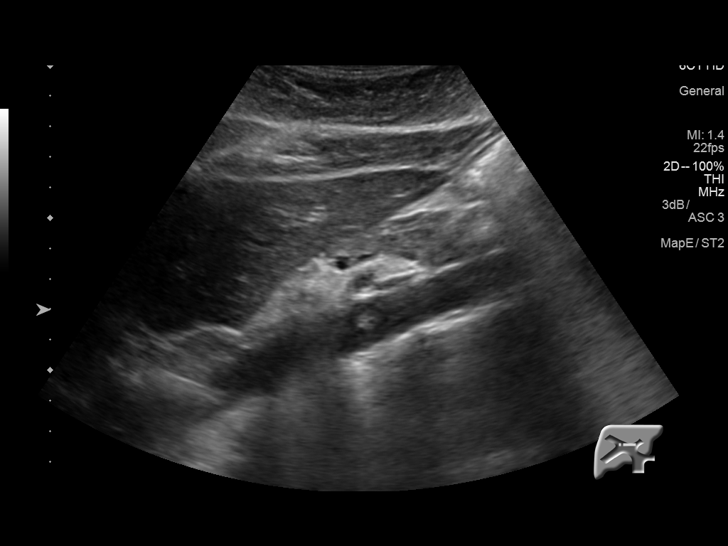
[im 39/59]
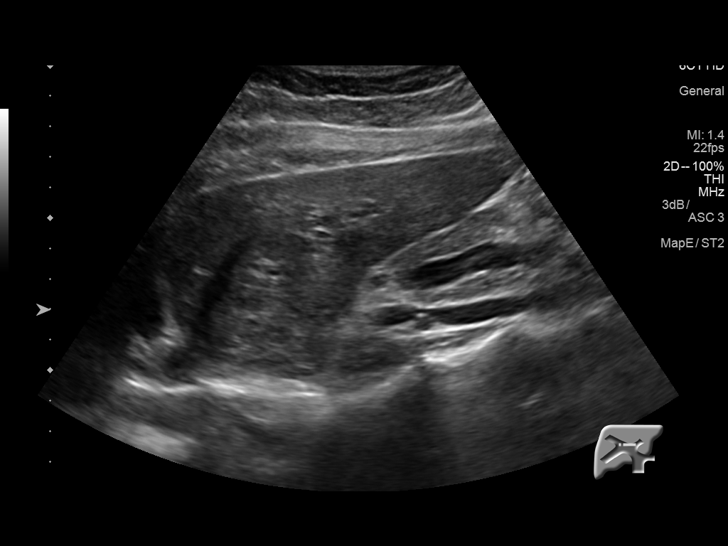
[im 44/59]
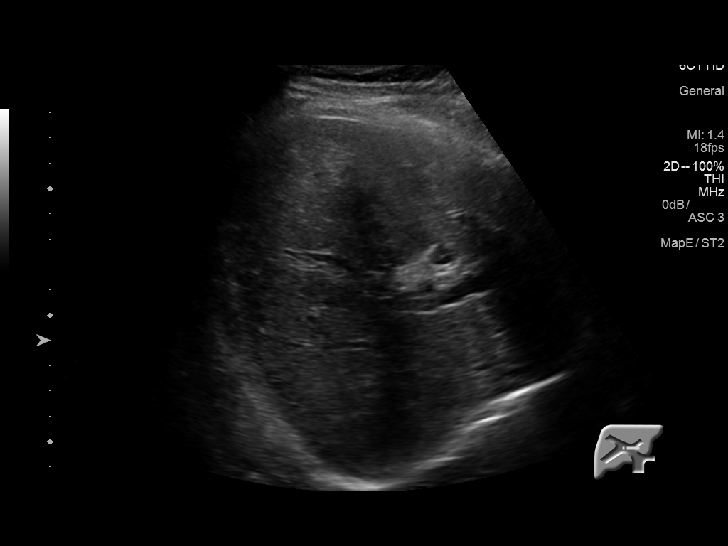
[im 49/59]
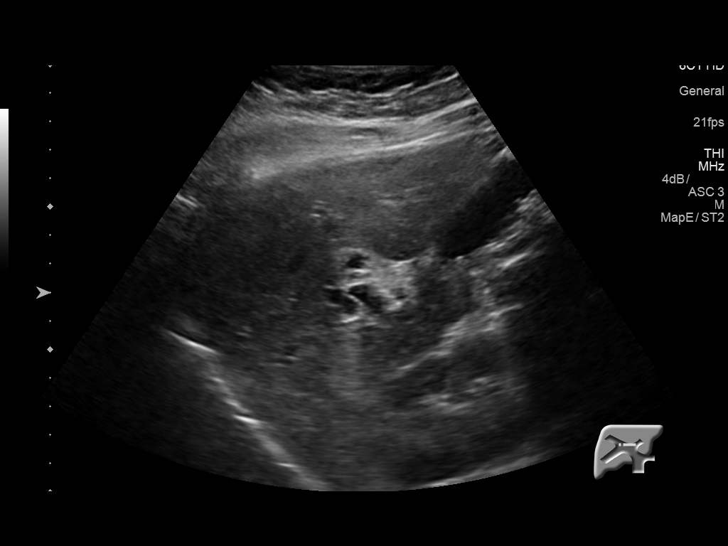
[im 54/59]
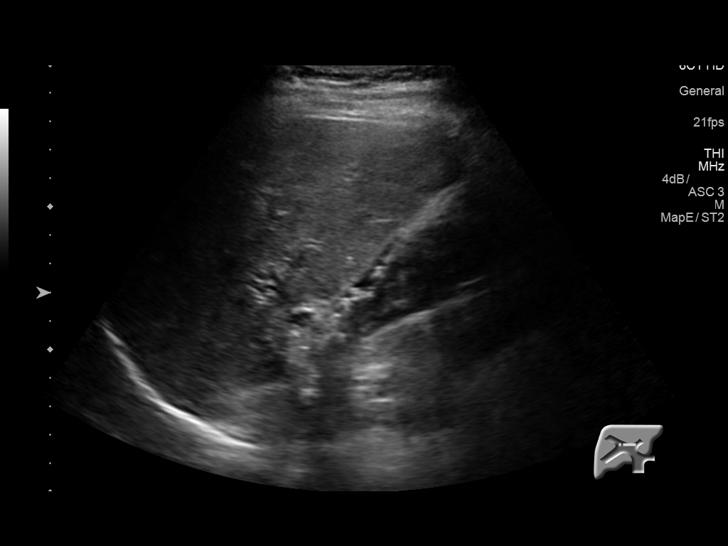
[im 59/59]
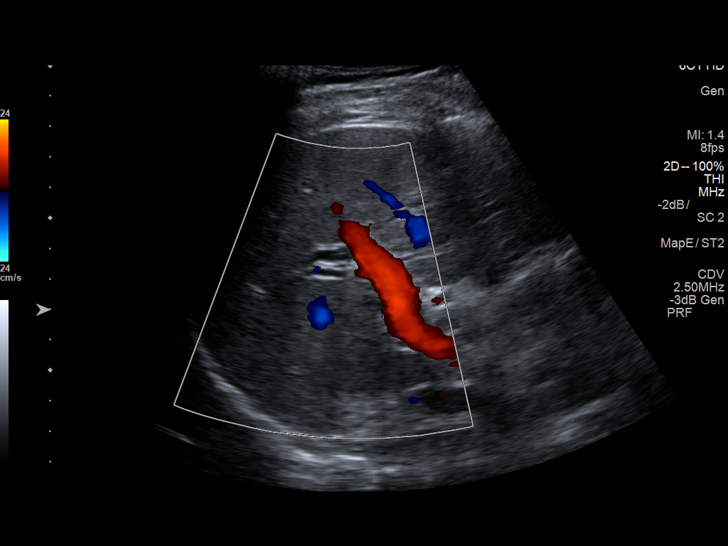

[14 of 25 positions shown; findings below may reference images not displayed]

FINDINGS: Gallbladder:

There are multiple calculi within the gallbladder. The largest
calculus measures 9 mm. Borderline gallbladder wall thickening. No
pericholecystic fluid.

Common bile duct:

Diameter: 8 mm.

Liver:

There is mild intrahepatic bile duct dilatation. No focal liver
lesions.
IMPRESSION: Cholelithiasis. Mild bile duct dilatation, both intrahepatic and
extrahepatic. This is concerning for possible choledocholithiasis.

## 2017-10-13 IMAGING — RF DG ERCP WO/W SPHINCTEROTOMY
1 series · 2 of 2 positions shown · non-contrast
Comparison: MRCP - 11/11/2015

CLINICAL DATA: ERCP with cholangiogram and biliary sweeping

EXAM:
ERCP
TECHNIQUE: Multiple spot images obtained with the fluoroscopic device and
submitted for interpretation post-procedure.

[Series 1: run · 2 of 2 slices shown]
[im 1/2]
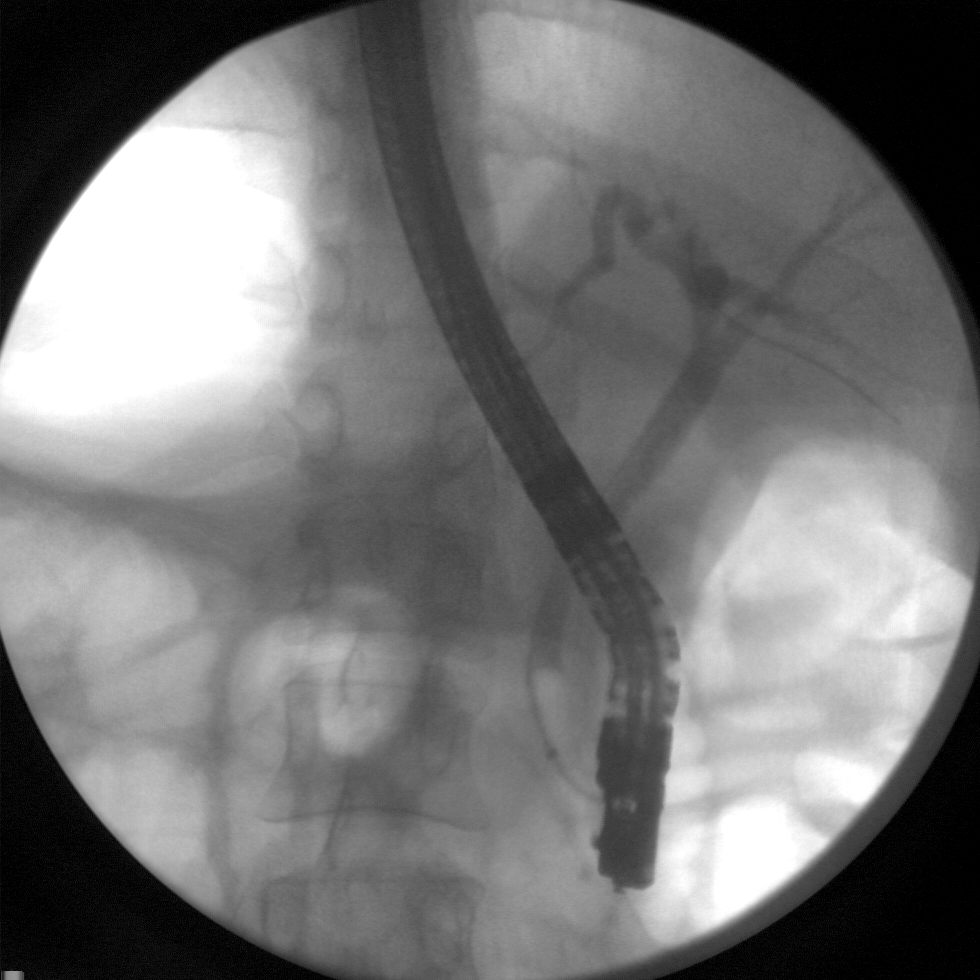
[im 2/2]
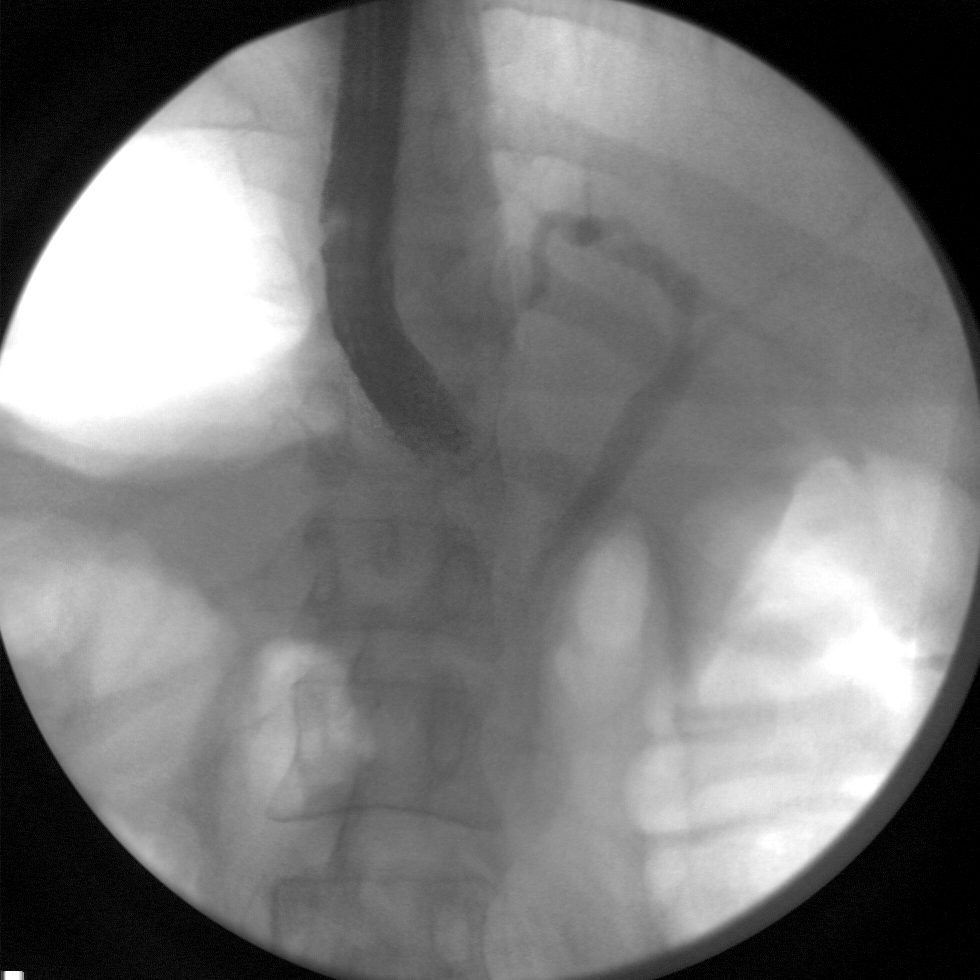

[2 of 2 positions shown; findings below may reference images not displayed]

FINDINGS: Two spot intraoperative fluoroscopic images of the right upper
abdominal quadrant during ERCP are provided for review.

Initial image demonstrates an ERCP probe overlying the right upper
abdominal quadrant.

There is selective cannulation and opacification of the common bile
duct. A biliary balloon is seen inflated within distal aspect of the
CBD. There is minimal opacification of the intrahepatic biliary tree
which appears nondilated. There is no opacification of the cystic or
pancreatic ducts.

No discrete filling defects are seen with the opacified portions of
the biliary tree.
IMPRESSION: ERCP as detailed above.

These images were submitted for radiologic interpretation only.
Please see the procedural report for the amount of contrast and the
fluoroscopy time utilized.
